# Patient Record
Sex: Female | Born: 2000 | Race: Black or African American | Hispanic: No | Marital: Single | Smoking: Never smoker
Health system: Southern US, Community
[De-identification: ages and names within clinical notes are randomized; demographics above are authoritative.]

---
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
## 2021-02-24 ENCOUNTER — Other Ambulatory Visit: Payer: Self-pay

## 2021-02-24 ENCOUNTER — Ambulatory Visit (HOSPITAL_COMMUNITY)
Admission: RE | Admit: 2021-02-24 | Discharge: 2021-02-24 | Disposition: A | Discharge status: Home / Self Care | Payer: Medicaid Other | Source: Ambulatory Visit | Attending: Internal Medicine | Admitting: Internal Medicine

## 2021-02-24 ENCOUNTER — Encounter (HOSPITAL_COMMUNITY): Payer: Self-pay

## 2021-02-24 VITALS — BP 120/78 | HR 80 | Temp 98.7°F | Resp 17 | Wt 255.0 lb

## 2021-02-24 DIAGNOSIS — N898 Other specified noninflammatory disorders of vagina: Secondary | ICD-10-CM

## 2021-02-24 DIAGNOSIS — Z113 Encounter for screening for infections with a predominantly sexual mode of transmission: Secondary | ICD-10-CM | POA: Diagnosis present

## 2021-02-24 MED ORDER — LIDOCAINE HCL (PF) 1 % IJ SOLN
INTRAMUSCULAR | Status: AC
  Filled 2021-02-24: qty 2

## 2021-02-24 MED ORDER — CEFTRIAXONE SODIUM 500 MG IJ SOLR
500.0000 mg | Freq: Once | INTRAMUSCULAR | Status: AC
  Administered 2021-02-24: 500 mg via INTRAMUSCULAR

## 2021-02-24 MED ORDER — CEFTRIAXONE SODIUM 500 MG IJ SOLR
INTRAMUSCULAR | Status: AC
  Filled 2021-02-24: qty 500

## 2021-02-24 NOTE — Discharge Instructions (Addendum)
We went ahead and treated you today for gonorrhea.  Further testing has been sent off.  Our nurse will contact you should you require any further treatment.  Please return for any worsening or persistent symptoms.

## 2021-02-24 NOTE — ED Provider Notes (Signed)
[facility]    CSN: [id number] Arrival date & time: 02/24/21  0950      History   Chief Complaint Chief Complaint  Patient presents with  . Appointment  . Vaginal Discharge  . SEXUALLY TRANSMITTED DISEASE  . Dysuria    HPI Leslie Perkins is a 20 y.o. female   Reports burning c urination and pain c intercourse in February and March that has subsided.  States she had significant amount of greenish vaginal discharge at that time that has since improved but still with some discharge.  She denies any recent fever or chills, headache, eye pain, sore throat, dysuria.  States ex-boyfriend recently told her he was treated for gonorrhea in April and wanted to be tested.   History reviewed. No pertinent past medical history.  There are no problems to display for this patient.   History reviewed. No pertinent surgical history.  OB History   No obstetric history on file.      Home Medications    Prior to Admission medications   Not on File    Family History History reviewed. No pertinent family history.  Social History Social History   Tobacco Use  . Smoking status: Light Tobacco Smoker  . Smokeless tobacco: Never Used     Allergies   Patient has no known allergies.   Review of Systems As stated in HPI otherwise negative   Physical Exam Triage Vital Signs ED Triage Vitals  Enc Vitals Group     BP 02/24/21 1012 120/78     Pulse Rate 02/24/21 1012 80     Resp 02/24/21 1012 17     Temp 02/24/21 1012 98.7 F (37.1 C)     Temp Source 02/24/21 1012 Oral     SpO2 02/24/21 1012 97 %     Weight --      Height --      Head Circumference --      Peak Flow --      Pain Score 02/24/21 1009 0     Pain Loc --      Pain Edu? --      Excl. in GC? --    No data found.  Updated Vital Signs BP 120/78 (BP Location: Right Arm)   Pulse 80   Temp 98.7 F (37.1 C) (Oral)   Resp 17   Wt 115.7 kg   LMP 02/17/2021 (Exact Date)   SpO2 97%   Visual  Acuity Right Eye Distance:   Left Eye Distance:   Bilateral Distance:    Right Eye Near:   Left Eye Near:    Bilateral Near:     Physical Exam Constitutional:      General: She is not in acute distress.    Appearance: Normal appearance. She is not ill-appearing or toxic-appearing.  Abdominal:     General: Bowel sounds are normal.     Palpations: Abdomen is soft.  Skin:    General: Skin is warm and dry.  Neurological:     General: No focal deficit present.     Mental Status: She is alert.  Psychiatric:        Mood and Affect: Mood normal.        Behavior: Behavior normal.     UC Treatments / Results  Labs (all labs ordered are listed, but only abnormal results are displayed) Labs Reviewed  CERVICOVAGINAL ANCILLARY ONLY    EKG   Radiology No results found.  Procedures Procedures (including critical care time)  Medications Ordered in UC Medications  cefTRIAXone (ROCEPHIN) injection 500 mg (500 mg Intramuscular Given 02/24/21 1105)    Initial Impression / Assessment and Plan / UC Course  I have reviewed the triage vital signs and the nursing notes.  Pertinent labs & imaging results that were available during my care of the patient were reviewed by me and considered in my medical decision making (see chart for details).  Vaginal discharge  -Known exposure to gonorrhea therefore we will go ahead and treat with IM Rocephin -We will send STI screening to determine need for any further treatment -Follow-up for upper persistent or worsening symptoms  Reviewed expections re: course of current medical issues. Questions answered. Outlined signs and symptoms indicating need for more acute intervention. Pt verbalized understanding. AVS given  Final Clinical Impressions(s) / UC Diagnoses   Final diagnoses:  Vaginal discharge  Routine screening for STI (sexually transmitted infection)     Discharge Instructions     We went ahead and treated you today for  gonorrhea.  Further testing has been sent off.  Our nurse will contact you should you require any further treatment.  Please return for any worsening or persistent symptoms.    ED Prescriptions    None     PDMP not reviewed this encounter.   Rolla Etienne, NP 02/24/21 [phone]

## 2021-02-24 NOTE — ED Triage Notes (Signed)
Pt c/o vaginal discharge and dysuria X 4 months. Pt states her ex partner recently told her he had Gonorrhea in April.

## 2021-02-25 LAB — CERVICOVAGINAL ANCILLARY ONLY
Bacterial Vaginitis (gardnerella): POSITIVE — AB
Chlamydia: NEGATIVE
Comment: NEGATIVE
Comment: NEGATIVE
Comment: NEGATIVE
Comment: NORMAL
Neisseria Gonorrhea: NEGATIVE
Trichomonas: POSITIVE — AB

## 2021-02-28 ENCOUNTER — Telehealth (HOSPITAL_COMMUNITY): Payer: Self-pay | Admitting: Emergency Medicine

## 2021-02-28 MED ORDER — METRONIDAZOLE 500 MG PO TABS
500.0000 mg | ORAL_TABLET | Freq: Two times a day (BID) | ORAL | 0 refills | Status: DC
Start: 2021-02-28 — End: 2021-08-30

## 2021-07-25 ENCOUNTER — Encounter (HOSPITAL_COMMUNITY): Payer: Self-pay | Admitting: Emergency Medicine

## 2021-07-25 ENCOUNTER — Ambulatory Visit (HOSPITAL_COMMUNITY)
Admission: EM | Admit: 2021-07-25 | Discharge: 2021-07-25 | Disposition: A | Discharge status: Home / Self Care | Payer: Medicaid Other | Attending: Student | Admitting: Student

## 2021-07-25 ENCOUNTER — Other Ambulatory Visit: Payer: Self-pay

## 2021-07-25 ENCOUNTER — Ambulatory Visit (HOSPITAL_COMMUNITY): Payer: Self-pay

## 2021-07-25 DIAGNOSIS — Z3A01 Less than 8 weeks gestation of pregnancy: Secondary | ICD-10-CM

## 2021-07-25 DIAGNOSIS — Z3201 Encounter for pregnancy test, result positive: Secondary | ICD-10-CM | POA: Diagnosis not present

## 2021-07-25 LAB — POC URINE PREG, ED: Preg Test, Ur: POSITIVE — AB

## 2021-07-25 NOTE — ED Triage Notes (Signed)
Pt LMP 8/20. Reports missed period in September and took home test that was positive. Pt needing confirmation that she is pregnant.

## 2021-07-25 NOTE — Discharge Instructions (Signed)
-  You're pregnant -You can establish care with an OB- information below

## 2021-07-25 NOTE — ED Provider Notes (Signed)
[facility]    CSN: [id number] Arrival date & time: 07/25/21  1515      History   Chief Complaint Chief Complaint  Patient presents with   Possible Pregnancy    HPI Damyiah Wagoner is a 20 y.o. female presenting for confirmation of pregnancy test. Positive home test. LMP 8/20. Feeling well. Denies hematuria, dysuria, frequency, urgency, back pain, n/v/d/abd pain, fevers/chills, abdnormal vaginal discharge. Denies STI risk.   HPI  History reviewed. No pertinent past medical history.  There are no problems to display for this patient.   History reviewed. No pertinent surgical history.  OB History   No obstetric history on file.      Home Medications    Prior to Admission medications   Medication Sig Start Date End Date Taking? Authorizing Provider  metroNIDAZOLE (FLAGYL) 500 MG tablet Take 1 tablet (500 mg total) by mouth 2 (two) times daily. 02/28/21   Lamptey, Britta Mccreedy, MD    Family History No family history on file.  Social History Social History   Tobacco Use   Smoking status: Light Smoker   Smokeless tobacco: Never     Allergies   Patient has no known allergies.   Review of Systems Review of Systems  Constitutional:  Negative for appetite change, chills, diaphoresis and fever.  Respiratory:  Negative for shortness of breath.   Cardiovascular:  Negative for chest pain.  Gastrointestinal:  Negative for abdominal pain, blood in stool, constipation, diarrhea, nausea and vomiting.  Genitourinary:  Negative for decreased urine volume, difficulty urinating, dysuria, flank pain, frequency, genital sores, hematuria, urgency, vaginal bleeding, vaginal discharge and vaginal pain.  Musculoskeletal:  Negative for back pain.  Neurological:  Negative for dizziness, weakness and light-headedness.  All other systems reviewed and are negative.   Physical Exam Triage Vital Signs ED Triage Vitals [07/25/21 1651]  Enc Vitals Group     BP 125/85     Pulse  Rate 78     Resp 18     Temp 98.3 F (36.8 C)     Temp Source Oral     SpO2 98 %     Weight      Height      Head Circumference      Peak Flow      Pain Score 0     Pain Loc      Pain Edu?      Excl. in GC?    No data found.  Updated Vital Signs BP 125/85 (BP Location: Left Arm)   Pulse 78   Temp 98.3 F (36.8 C) (Oral)   Resp 18   LMP 06/11/2021   SpO2 98%   Visual Acuity Right Eye Distance:   Left Eye Distance:   Bilateral Distance:    Right Eye Near:   Left Eye Near:    Bilateral Near:     Physical Exam Vitals reviewed.  Constitutional:      General: She is not in acute distress.    Appearance: Normal appearance. She is not ill-appearing.  HENT:     Head: Normocephalic and atraumatic.     Mouth/Throat:     Mouth: Mucous membranes are moist.     Comments: Moist mucous membranes Eyes:     Extraocular Movements: Extraocular movements intact.     Pupils: Pupils are equal, round, and reactive to light.  Cardiovascular:     Rate and Rhythm: Normal rate and regular rhythm.     Heart sounds: Normal heart sounds.  Pulmonary:     Effort: Pulmonary effort is normal.     Breath sounds: Normal breath sounds. No wheezing, rhonchi or rales.  Abdominal:     General: Bowel sounds are normal. There is no distension.     Palpations: Abdomen is soft. There is no mass.     Tenderness: There is no abdominal tenderness. There is no right CVA tenderness, left CVA tenderness, guarding or rebound.  Skin:    General: Skin is warm.     Capillary Refill: Capillary refill takes less than 2 seconds.     Comments: Good skin turgor  Neurological:     General: No focal deficit present.     Mental Status: She is alert and oriented to person, place, and time.  Psychiatric:        Mood and Affect: Mood normal.        Behavior: Behavior normal.     UC Treatments / Results  Labs (all labs ordered are listed, but only abnormal results are displayed) Labs Reviewed  POC URINE  PREG, ED - Abnormal; Notable for the following components:      Result Value   Preg Test, Ur POSITIVE (*)    All other components within normal limits    EKG   Radiology No results found.  Procedures Procedures (including critical care time)  Medications Ordered in UC Medications - No data to display  Initial Impression / Assessment and Plan / UC Course  I have reviewed the triage vital signs and the nursing notes.  Pertinent labs & imaging results that were available during my care of the patient were reviewed by me and considered in my medical decision making (see chart for details).     This patient is a very pleasant 20 y.o. year old female presenting for confirmation of positive pregnancy test. Afebrile, nontachycardic, no reproducible abd pain or CVAT. LMP 8/20. U-preg: positive. OB resources provided., ED return precautions discussed. Patient verbalizes understanding and agreement.    Final Clinical Impressions(s) / UC Diagnoses   Final diagnoses:  Positive pregnancy test  Pregnancy with fewer than 8 completed weeks gestation     Discharge Instructions      -You're pregnant -You can establish care with an OB- information below     ED Prescriptions   None    PDMP not reviewed this encounter.   Rhys Martini, PA-C 07/25/21 1709

## 2021-08-24 ENCOUNTER — Telehealth (INDEPENDENT_AMBULATORY_CARE_PROVIDER_SITE_OTHER): Payer: Medicaid Other

## 2021-08-24 ENCOUNTER — Other Ambulatory Visit: Payer: Self-pay

## 2021-08-24 DIAGNOSIS — Z136 Encounter for screening for cardiovascular disorders: Secondary | ICD-10-CM

## 2021-08-24 DIAGNOSIS — Z3480 Encounter for supervision of other normal pregnancy, unspecified trimester: Secondary | ICD-10-CM | POA: Insufficient documentation

## 2021-08-24 DIAGNOSIS — Z3A00 Weeks of gestation of pregnancy not specified: Secondary | ICD-10-CM

## 2021-08-24 MED ORDER — BLOOD PRESSURE MONITORING DEVI: 1.0000 | 0 refills | Status: AC

## 2021-08-24 NOTE — Progress Notes (Signed)
New OB Intake  I connected with  Leslie Perkins on 08/24/21 at  1:15 PM EDT by MyChart Video Visit and verified that I am speaking with the correct person using two identifiers. Nurse is located at [facility] and pt is located at home .  I discussed the limitations, risks, security and privacy concerns of performing an evaluation and management service by telephone and the availability of in person appointments. I also discussed with the patient that there may be a patient responsible charge related to this service. The patient expressed understanding and agreed to proceed.  I explained I am completing New OB Intake today. We discussed her EDD of 03/18/22 that is based on LMP of 06/11/21. Pt is G1/P0. I reviewed her allergies, medications, Medical/Surgical/OB history, and appropriate screenings. I informed her of [facility] services. Based on history, this is a/an  pregnancy uncomplicated .   There are no problems to display for this patient.   Concerns addressed today  Delivery Plans:  Plans to deliver at [facility].   MyChart/Babyscripts MyChart access verified. I explained pt will have some visits in office and some virtually. Babyscripts instructions given and order placed. Patient verifies receipt of registration text/e-mail. Account successfully created and app downloaded.  Blood Pressure Cuff  Blood pressure cuff ordered for patient to pick-up from [employer]. Explained after first prenatal appt pt will check weekly and document in Babyscripts.  Weight scale: Patient    have weight scale. Weight scale ordered for patient to pick up form [facility].   Anatomy US Explained first scheduled [place] will be around 19 weeks. Anatomy US scheduled for 10/21/21 at 10:30a. Pt notified to arrive at 10:15a.  Labs Discussed Avelina Laine genetic screening with patient. Would like both Panorama and Horizon drawn at new OB visit. Routine prenatal labs needed.  Covid Vaccine Patient has not covid vaccine.    Mother/ Baby Dyad Candidate?    If yes, offer as possibility  Informed patient of [facility] website  and placed link in her AVS.   Social Determinants of Health Food Insecurity: Patient expresses food insecurity. Food Market information given to patient; explained patient may visit at the end of first OB appointment. WIC Referral: Patient is interested in referral to [facility].  Transportation: Patient denies transportation needs. Childcare: Discussed no children allowed at ultrasound appointments. Offered childcare services; patient declines childcare services at this time.  Send link to Pregnancy Navigators   Placed OB Box on problem list and updated  First visit review I reviewed new OB appt with pt. I explained she will have a pelvic exam, ob bloodwork with genetic screening, and PAP smear. Explained pt will be seen by Dr. Merian Capron at first visit; encounter routed to appropriate provider. Explained that patient will be seen by pregnancy navigator following visit with provider. [facility] information placed in AVS.   Leslie Perkins, CMA 08/24/2021  1:31 PM

## 2021-08-24 NOTE — Patient Instructions (Signed)
AREA PEDIATRIC/FAMILY PRACTICE PHYSICIANS  [place] ([address]) [facility] Chambliss, MD; Eniola, MD; Hale, MD; Hensel, MD; McDiarmid, MD; McIntyer, MD; Bette Brienza, MD; Walden, MD [address] [place], [place] [address] [phone] Mon-Fri 8:30-12:30, 1:30-5:00 Providers come to see babies at [facility] Accepting Medicaid [facility] providers who accept newborns: Koirala, MD; Morrow, MD; Wolters, MD [address], [place], [place] [address] [phone] Mon-Fri 8:00-5:30 Babies seen by providers at [facility] Does NOT accept Medicaid Please call early in hospitalization for appointment (limited availability)  [facility] Mulberry, MD [address] [place], [place] [address] [phone] Mon, Tue, Thur, Fri 8:30-5:00, Wed 10:00-7:00 (closed 1-2pm) Babies seen by [facility] providers Accepting Medicaid Rubin - Pediatrician Rubin, MD [address], [place], [place] [address] [phone] Mon-Fri 8:30-5:00, Sat 8:30-12:00 Provider comes to see babies at [facility] Accepting Medicaid Must have been referred from current patients or contacted office prior to delivery [facility] ([facility]) Brown, MD; Chandler, MD; Ettefagh, MD; Grant, MD; [place], MD; McCormick, MD; McQueen, MD; Prose, MD; Simha, MD; Stanley, MD; Stryffeler, NP; Tebben, NP [address], [place], [place] [address] [phone] Mon, Tue, Thur, Fri 8:30-5:30, Wed 9:30-5:30, Sat 8:30-12:30 Babies seen by [facility] providers Accepting Medicaid Only accepting infants of first-time parents or siblings of current patients Hospital discharge coordinator will make follow-up appointment Jack Amos [address], [place], [place]  [address] [phone]   Fax - [phone] [facility] [address], [place], [place]  [address] Phone - [phone]   Fax - [phone] Shilpa Gosrani [address], [place], [place]  [address] [phone]  [place] ([address]) [facility] Bates, MD; Brassfield, MD; Cooper, Cox, MD; MD; Davis, MD; Dovico, MD; Ettefaugh, MD; Little, MD; Lowe, MD; Keiffer, MD; Melvin, MD; Sumner, MD; Williams, MD [address], [place], [place] [address] [phone] Mon-Fri 8:30-5:00 (extended evenings Mon-Thur as needed), Sat-Sun 10:00-1:00 Providers come to see babies at [facility] Accepting Medicaid for families of first-time babies and families with all children in the household age 3 and under. Must register with office prior to making appointment (M-F only). [facility] Henson, NP; Knapp, MD; Lalonde, MD; Tysinger, PA [address] [place], [place] [address] [phone] Mon-Fri 8:00-5:00 Babies seen by providers at [facility] Does NOT accept Medicaid/Commercial Insurance Only [facility] ([facility])  Artis, MD; Barnes, MD; Bratton, MD; Coccaro, MD; Lockett Gardner, MD; Kramer, MD; Marshall, MD; Netherton, MD; Poleto, MD; Skinner, MD [address] [place], [place] [address] [phone] Mon-Fri 8:30-5:30, Sat (Oct.-Mar.) 9:00-1:00 Babies seen by providers at [facility] Accepting Medicaid  [place] ([address]) [facility] Reid, MD; Warner, MD [address], [place], [place] [address] [phone] Mon-Fri 8:30-5:00, Sat 8:30-12:00 Providers come to see babies at [facility] Does NOT accept Medicaid [facility] Becker, PA; Hagler, MD; Scifres, PA; Sun, MD; Swayne, MD [address], , [place] [address] [phone] Mon-Fri 8:00-5:00 Babies seen by providers at [facility] Does NOT accept Medicaid Only accepting babies of parents who  are patients Please call early in hospitalization for appointment (limited availability)  Pediatricians Clark, MD; Frye, MD; Kelleher, MD; Mack, NP; Miller, MD; O'Keller, MD; Patterson, NP; Pudlo, MD; Puzio, MD; Thomas, MD; Tucker, MD; Twiselton, MD [address], [place], [place] [address] [phone] Mon-Fri 8:00-5:00, Sat 9:00-12:00 Providers come to see babies at [facility] Does NOT accept Medicaid  [place] ([address]) [facility] providers accepting new patients: Brake, NP; Wharton, PA [address], [place], [place] [address] [phone] Mon-Fri 8:00-5:00 Babies seen by providers at [facility] Does NOT accept Medicaid Only accepting babies of parents who are patients Please call early in hospitalization for appointment (limited availability) [facility] Gay, MD; Quinlan, MD [address] [place], [place] [address] [phone] (press 1 to schedule appointment) Mon-Fri 8:00-5:00 Providers come to see babies at [facility] Does NOT accept Medicaid [facility] Mazer, MD [address] [place], [place] [address] [phone] Mon-Fri 8:30-5:00 (lunch 12:30-1:00), extended hours by appointment only Wed 5:00-6:30 Babies seen by [facility] providers Accepting Medicaid [facility] Banks, MD; Jordan, MD; Koberlein, MD [address], [place], [place] [address] [phone] Mon-Fri 8:00-5:00 Babies seen by [facility] providers Does NOT accept Medicaid [facility] Parker, MD; Hunter, MD; Wallace, DO [address] [place], [place] [address] [phone] Mon-Fri 8:00-5:00 Babies seen by [facility] providers Does NOT accept Medicaid [facility] Brandon, PA; Brecken, PA; Christy, NP; Dees, MD; DeClaire, MD; DeWeese, MD; Hansen, NP; Mills, NP; Parrish, NP; Smoot, NP; Summer, MD; Vapne,  MD [address] [place], [place] [address] [phone] Mon-Fri 8:30-5:00, Sat 10:00-1:00 Providers come to see babies at [facility] Does NOT accept Medicaid Free prenatal information session Tuesdays at 4:45pm [facility] Bouska, MD; Gordon, PA; Jeffery, PA; Weber, PA [address] [place] [address] [phone] Mon-Fri 7:30-5:30 Babies seen by [facility] providers [facility] [address], [place], [place]  [address] [phone]   Fax - [phone]  [place] ([address] & [address]) [facility] [place] [address] [place], [place] [address] [phone] Mon-Thur 8:00-6:00 Providers come to see babies at [facility] Accepting Medicaid [facility] Anderson, NP; Badger, MD; Beal, PA; Spencer, PA [address] [place], [place] [address] [phone] Mon-Thur 7:30-7:30, Fri 7:30-4:30 Babies seen by [facility] providers Accepting Medicaid [facility] Agbuya, MD; Klett, NP; Romgoolam, MD [address], [place], [place] [address] [phone] Mon-Fri 8:30-5:00, Sat 8:30-12:00 Providers come to see babies at [facility] Accepting Medicaid Must have "Meet & Greet" appointment at office prior to delivery [facility] ([facility]) McCord, MD; Wallace, MD; Wood, MD [address], [place], [place] [address] [phone] Mon-Wed 8:00-6:00, Thur-Fri 8:00-5:00, Sat 9:00-12:00 Providers come to see babies at [facility] Does NOT accept Medicaid Only accepting siblings of current patients [facility]  [address], [place], [place]  [address] [phone]   Fax - [phone] [facility] [address], [place], [place]  [address] [phone]   Fax -  [phone]  [place] ([address] & [address]) [facility] Cirigliano, DO; Matthews, DO [address] [place], [place] [address] [phone] Mon-Fri 7:00-5:00 Babies seen by [facility] providers Does NOT accept Medicaid [facility] Briscoe, MD; Howley, PA; Moreira, PA [address], [place], [place] [address] [phone] Mon-Fri 8:00-5:00 Babies seen by [facility] providers Accepting Medicaid [facility] Boyd, MD; Church, PA; Jones, NP; Osborn, PA [address], , [place] [address] [phone] Mon-Fri 8:00-5:00 Babies seen by providers at [facility] Accepting Medicaid  [place] ([address]) [facility] [place] Wendling, DO [address] [place], [place] [address] [phone] Mon-Fri 8:00-5:00 Babies seen by [facility] providers Does NOT accept Medicaid Limited availability, please call early in hospitalization to schedule follow-up [facility] Calderon, PA; Cummings, MD; Dillard, MD; Martin, PA; Olson, MD; VanDeven, PA [address], [place], [place] [address] [phone] Mon-Fri 8:30-5:00, Sat 9:00-12:00 Babies seen by providers at [facility] Accepting Medicaid Please register online then schedule online or call office [email or web address] Hunter, NP; Kumar, MD; Martin Rogers, PA [address], [place], [place] [address] [phone] Mon-Fri 8:00-5:00 Babies seen by providers at [facility] Accepting Medicaid [facility] ([facility]) McFarland, MD; Kristi Fleenor, NP; West, MD [address], [place], [place] [address] [phone] Mon-Fri 8:00-5:30, Sat&Sun by appointment (phones open at  8:30) Babies seen by [facility] providers Accepting Medicaid Must be a first-time baby or sibling of current patient [facility] [place]  [address], [place], [place]  [address] [phone]   Fax - [phone]  [place] ([address]) [facility] Brown, PA; Cowen, PA; Rice, MD; Helton, PA; Spry, MD [address] [place], [place] [address] [phone] Mon-Thur 8:00-7:00, Fri 8:00-5:00, Sat 8:00-12:00, Sun 9:00-12:00 Babies seen by [facility] providers Accepting Medicaid [facility] Coe-Goins, MD; Marshall, MD; Pierre-Louis, MD [address], [place], [place] [address] [phone] Mon-Thur 8:00-5:00 Babies seen by providers at [facility] Accepting Medicaid [facility] Bratton, MD; Coe-Goins, MD; Hayes, MD; Lewis, MD; List, MD; Lott, MD; Marshall, MD; Moran, MD; O'Ernan Runkles, MD; Pierre-Louis, MD; Pitonzo, MD; Scholer, MD; Spangle, MD [address] [place], [place] [address] [phone] Mon-Fri 8:00-5:30, Sat (Oct.-Mar.) 9:00-1:00 Babies seen by providers at [facility] Accepting Medicaid Must fill out new patient packet, available online at [email or web address] [facility] ([facility]) Friddle, NP; Harris, NP; Kelly, NP; Logan, MD; Melvin, PA; Poth, MD; Ramadoss, MD; Stanton, NP [address], [place], [place] [address] [phone] Mon-Thur 8:00-5:30, Fri 8:00-5:00 Babies seen by providers at [facility] Accepting Medicaid  [place] ([address]) [facility] Dixon, PA; Wilkes-Barre, MD; Pickard, MD; Tapia, PA [address], [place], [place] [address] [phone] Mon-Fri 8:00-5:00 Babies seen by providers at [facility] Accepting Medicaid   [place] ([address]) [facility] [place] Masneri, DO; Meyers, MD; Nelson, PA [address], [place], [place] [address] [phone] Mon-Fri 8:00-5:00 Babies seen by providers at [facility] Does NOT accept Medicaid Limited appointment availability, please call early in hospitalization  [facility] [place] Kunedd, DO; McGowen, MD [address], [place], [place] [address] [phone] Mon-Fri 8:00-5:00 Babies seen by [facility] providers Does NOT accept Medicaid [facility] [place] Cameron, MD; MacDonald, MD; Michaels, PA; Nayak, MD [address] [place] [address], [place], [place] [address] [phone] Mon-Fri 8:00-5:00 After hours clinic ([address] [place], [place] [address]) [phone] Mon-Fri 5:00-8:00, Sat 12:00-6:00, Sun 10:00-4:00 Babies seen by [facility] providers Accepting Medicaid [facility] [place] [address], [place], [place]  [address] [phone]   Fax - [phone]  [place] ([address]) [facility] [place] [facility] Andy, MD [address], [place], [place] [address] [phone] Mon-Fri 8:00-5:00 Babies seen by [facility] providers Does NOT accept Medicaid [facility] [place] ([facility] [place]) Eksir, MD [address], [place], [place] [address] [phone] Mon-Thur 8:00-7:00, Fri 8:00-5:00, Sat 8:00-12:00 Babies seen by providers at [facility] Accepting Medicaid - but does not have vaccinations in office (must be received elsewhere) Limited availability, please call early in hospitalization  [place] ([address]) [facility]  Charlene Flemming, MD [address], [place] [address] [phone]  Fax [phone]  [facility]  Kimberly Newton, MD, Annamarie Streilein, PA, Carla Hampton, PA [address] [place], [place]  [address] [phone] [facility]  [address], [place], [place] [address] [phone] [address], [place], [place] [address] [phone] (West Office)  [facility] [address], [place], [place] [address] [phone] [facility] [address] [place], [place], [place] [address] [phone] [facility] [address], [place], [place] [address] [phone] [facility] [address], [place], [place] [address] [phone] [facility] [address], [place], [place] [address] [phone] [facility] [address], [place], [place] [address] [phone] [facility]  [address], [place], [place] [address] [phone] Dr[facility] [address] [place], [place], [place] [address] [phone] [facility] [address], [place], [place] [address] [phone] [facility] [address], ,  [address] [phone]   At our [facility], we work as an integrated team, providing care to address both physical and emotional health. Your medical provider may refer you to see our Behavioral Health Clinician (BHC) on the same day you see your medical provider, as availability permits; often scheduled virtually at your convenience.  Our BHC is available to all patients, visits generally last between 20-30 minutes, but can be longer or shorter, depending on patient need. The BHC offers help with stress management, coping with symptoms of depression and anxiety, major life changes , sleep issues, changing risky behavior, grief and loss, life stress, working on personal life goals, and  behavioral health issues, as these all affect your overall health and wellness.  The BHC is NOT available for the following: FMLA paperwork, court-ordered evaluations, specialty assessments (custody or disability), letters to employers, or  obtaining certification for an emotional support animal. The BHC does not provide long-term therapy. You have the right to refuse integrated behavioral health services, or to reschedule to see the BHC at a later date.  Confidentiality exception: If it is suspected that a child or disabled adult is being abused or neglected, we are required by law to report that to either Child Protective Services or Adult Protective Services.  If you have a diagnosis of Bipolar affective disorder, Schizophrenia, or recurrent Major depressive disorder, we will recommend that you establish care with a psychiatrist, as these are lifelong, chronic conditions, and we want your overall emotional health and medications to be more closely monitored. If you anticipate needing extended maternity leave due to mental health issues postpartum, it it recommended you inform your medical provider, so we can put in a referral to a psychiatrist as soon as possible. The BHC is unable to recommend an extended maternity leave for mental health issues. Your medical provider   or BHC may refer you to a therapist for ongoing, traditional therapy, or to a psychiatrist, for medication management, if it would benefit your overall health. Depending on your insurance, you may have a copay or be charged a deductible, depending on your insurance, to see the BHC. If you are uninsured, it is recommended that you apply for financial assistance. (Forms may be requested at the front desk for in-person visits, via MyChart, or request a form during a virtual visit).  If you see the BHC more than 6 times, you will have to complete a comprehensive clinical assessment interview with the BHC to resume integrated services.  For virtual visits with the BHC, you must be physically in the state of [place] at the time of the visit. For example, if you live in [place], you will have to do an in-person visit with the BHC, and your out-of-state insurance may not cover  behavioral health services in [place]. If you are going out of the state or country for any reason, the BHC may see you virtually when you return to [place], but not while you are physically outside of [place].   

## 2021-08-30 ENCOUNTER — Encounter: Payer: Medicaid Other | Admitting: Nurse Practitioner

## 2021-08-30 ENCOUNTER — Other Ambulatory Visit: Payer: Self-pay

## 2021-08-30 ENCOUNTER — Ambulatory Visit (INDEPENDENT_AMBULATORY_CARE_PROVIDER_SITE_OTHER): Payer: Medicaid Other | Admitting: Family Medicine

## 2021-08-30 VITALS — BP 136/80 | HR 76 | Ht 68.0 in | Wt 269.8 lb

## 2021-08-30 DIAGNOSIS — Z3480 Encounter for supervision of other normal pregnancy, unspecified trimester: Secondary | ICD-10-CM | POA: Diagnosis not present

## 2021-08-30 DIAGNOSIS — Z23 Encounter for immunization: Secondary | ICD-10-CM

## 2021-08-30 MED ORDER — PROMETHAZINE HCL 12.5 MG PO TABS: 12.5000 mg | ORAL_TABLET | Freq: Four times a day (QID) | ORAL | 2 refills | Status: DC | PRN

## 2021-08-30 NOTE — Patient Instructions (Signed)
Second Trimester of Pregnancy °The second trimester of pregnancy is from week 13 through week 27. This is months 4 through 6 of pregnancy. The second trimester is often a time when you feel your best. Your body has adjusted to being pregnant, and you begin to feel better physically. °During the second trimester: °Morning sickness has lessened or stopped completely. °You may have more energy. °You may have an increase in appetite. °The second trimester is also a time when the unborn baby (fetus) is growing rapidly. At the end of the sixth month, the fetus may be up to 12 inches long and weigh about 1½ pounds. You will likely begin to feel the baby move (quickening) between 16 and 20 weeks of pregnancy. °Body changes during your second trimester °Your body continues to go through many changes during your second trimester. The changes vary and generally return to normal after the baby is born. °Physical changes °Your weight will continue to increase. You will notice your lower abdomen bulging out. °You may begin to get stretch marks on your hips, abdomen, and breasts. °Your breasts will continue to grow and to become tender. °Dark spots or blotches (chloasma or mask of pregnancy) may develop on your face. °A dark line from your belly button to the pubic area (linea nigra) may appear. °You may have changes in your hair. These can include thickening of your hair, rapid growth, and changes in texture. Some people also have hair loss during or after pregnancy, or hair that feels dry or thin. °Health changes °You may develop headaches. °You may have heartburn. °You may develop constipation. °You may develop hemorrhoids or swollen, bulging veins (varicose veins). °Your gums may bleed and may be sensitive to brushing and flossing. °You may urinate more often because the fetus is pressing on your bladder. °You may have back pain. This is caused by: °Weight gain. °Pregnancy hormones that are relaxing the joints in your  pelvis. °A shift in weight and the muscles that support your balance. °Follow these instructions at home: °Medicines °Follow your health care provider's instructions regarding medicine use. Specific medicines may be either safe or unsafe to take during pregnancy. Do not take any medicines unless approved by your health care provider. °Take a prenatal vitamin that contains at least 600 micrograms (mcg) of folic acid. °Eating and drinking °Eat a healthy diet that includes fresh fruits and vegetables, whole grains, good sources of protein such as meat, eggs, or tofu, and low-fat dairy products. °Avoid raw meat and unpasteurized juice, milk, and cheese. These carry germs that can harm you and your baby. °You may need to take these actions to prevent or treat constipation: °Drink enough fluid to keep your urine pale yellow. °Eat foods that are high in fiber, such as beans, whole grains, and fresh fruits and vegetables. °Limit foods that are high in fat and processed sugars, such as fried or sweet foods. °Activity °Exercise only as directed by your health care provider. Most people can continue their usual exercise routine during pregnancy. Try to exercise for 30 minutes at least 5 days a week. Stop exercising if you develop contractions in your uterus. °Stop exercising if you develop pain or cramping in the lower abdomen or lower back. °Avoid exercising if it is very hot or humid or if you are at a high altitude. °Avoid heavy lifting. °If you choose to, you may have sex unless your health care provider tells you not to. °Relieving pain and discomfort °Wear a supportive bra   to prevent discomfort from breast tenderness. °Take warm sitz baths to soothe any pain or discomfort caused by hemorrhoids. Use hemorrhoid cream if your health care provider approves. °Rest with your legs raised (elevated) if you have leg cramps or low back pain. °If you develop varicose veins: °Wear support hose as told by your health care  provider. °Elevate your feet for 15 minutes, 3-4 times a day. °Limit salt in your diet. °Safety °Wear your seat belt at all times when driving or riding in a car. °Talk with your health care provider if someone is verbally or physically abusive to you. °Lifestyle °Do not use hot tubs, steam rooms, or saunas. °Do not douche. Do not use tampons or scented sanitary pads. °Avoid cat litter boxes and soil used by cats. These carry germs that can cause birth defects in the baby and possibly loss of the fetus by miscarriage or stillbirth. °Do not use herbal remedies, alcohol, illegal drugs, or medicines that are not approved by your health care provider. Chemicals in these products can harm your baby. °Do not use any products that contain nicotine or tobacco, such as cigarettes, e-cigarettes, and chewing tobacco. If you need help quitting, ask your health care provider. °General instructions °During a routine prenatal visit, your health care provider will do a physical exam and other tests. He or she will also discuss your overall health. Keep all follow-up visits. This is important. °Ask your health care provider for a referral to a local prenatal education class. °Ask for help if you have counseling or nutritional needs during pregnancy. Your health care provider can offer advice or refer you to specialists for help with various needs. °Where to find more information °American Pregnancy Association: americanpregnancy.org °American College of Obstetricians and Gynecologists: acog.org/en/Womens%20Health/Pregnancy °Office on Women's Health: womenshealth.gov/pregnancy °Contact a health care provider if you have: °A headache that does not go away when you take medicine. °Vision changes or you see spots in front of your eyes. °Mild pelvic cramps, pelvic pressure, or nagging pain in the abdominal area. °Persistent nausea, vomiting, or diarrhea. °A bad-smelling vaginal discharge or foul-smelling urine. °Pain when you  urinate. °Sudden or extreme swelling of your face, hands, ankles, feet, or legs. °A fever. °Get help right away if you: °Have fluid leaking from your vagina. °Have spotting or bleeding from your vagina. °Have severe abdominal cramping or pain. °Have difficulty breathing. °Have chest pain. °Have fainting spells. °Have not felt your baby move for the time period told by your health care provider. °Have new or increased pain, swelling, or redness in an arm or leg. °Summary °The second trimester of pregnancy is from week 13 through week 27 (months 4 through 6). °Do not use herbal remedies, alcohol, illegal drugs, or medicines that are not approved by your health care provider. Chemicals in these products can harm your baby. °Exercise only as directed by your health care provider. Most people can continue their usual exercise routine during pregnancy. °Keep all follow-up visits. This is important. °This information is not intended to replace advice given to you by your health care provider. Make sure you discuss any questions you have with your health care provider. °Document Revised: 03/17/2020 Document Reviewed: 01/22/2020 °Elsevier Patient Education © 2022 Elsevier Inc. ° °Contraception Choices °Contraception, also called birth control, refers to methods or devices that prevent pregnancy. °Hormonal methods °Contraceptive implant °A contraceptive implant is a thin, plastic tube that contains a hormone that prevents pregnancy. It is different from an intrauterine device (IUD). It   is inserted into the upper part of the arm by a health care provider. Implants can be effective for up to 3 years. °Progestin-only injections °Progestin-only injections are injections of progestin, a synthetic form of the hormone progesterone. They are given every 3 months by a health care provider. °Birth control pills °Birth control pills are pills that contain hormones that prevent pregnancy. They must be taken once a day, preferably at the  same time each day. A prescription is needed to use this method of contraception. °Birth control patch °The birth control patch contains hormones that prevent pregnancy. It is placed on the skin and must be changed once a week for three weeks and removed on the fourth week. A prescription is needed to use this method of contraception. °Vaginal ring °A vaginal ring contains hormones that prevent pregnancy. It is placed in the vagina for three weeks and removed on the fourth week. After that, the process is repeated with a new ring. A prescription is needed to use this method of contraception. °Emergency contraceptive °Emergency contraceptives prevent pregnancy after unprotected sex. They come in pill form and can be taken up to 5 days after sex. They work best the sooner they are taken after having sex. Most emergency contraceptives are available without a prescription. This method should not be used as your only form of birth control. °Barrier methods °Female condom °A female condom is a thin sheath that is worn over the penis during sex. Condoms keep sperm from going inside a woman's body. They can be used with a sperm-killing substance (spermicide) to increase their effectiveness. They should be thrown away after one use. °Female condom °A female condom is a soft, loose-fitting sheath that is put into the vagina before sex. The condom keeps sperm from going inside a woman's body. They should be thrown away after one use. °Diaphragm °A diaphragm is a soft, dome-shaped barrier. It is inserted into the vagina before sex, along with a spermicide. The diaphragm blocks sperm from entering the uterus, and the spermicide kills sperm. A diaphragm should be left in the vagina for 6-8 hours after sex and removed within 24 hours. °A diaphragm is prescribed and fitted by a health care provider. A diaphragm should be replaced every 1-2 years, after giving birth, after gaining more than 15 lb (6.8 kg), and after pelvic  surgery. °Cervical cap °A cervical cap is a round, soft latex or plastic cup that fits over the cervix. It is inserted into the vagina before sex, along with spermicide. It blocks sperm from entering the uterus. The cap should be left in place for 6-8 hours after sex and removed within 48 hours. A cervical cap must be prescribed and fitted by a health care provider. It should be replaced every 2 years. °Sponge °A sponge is a soft, circular piece of polyurethane foam with spermicide in it. The sponge helps block sperm from entering the uterus, and the spermicide kills sperm. To use it, you make it wet and then insert it into the vagina. It should be inserted before sex, left in for at least 6 hours after sex, and removed and thrown away within 30 hours. °Spermicides °Spermicides are chemicals that kill or block sperm from entering the cervix and uterus. They can come as a cream, jelly, suppository, foam, or tablet. A spermicide should be inserted into the vagina with an applicator at least 10-15 minutes before sex to allow time for it to work. The process must be repeated every time   you have sex. Spermicides do not require a prescription. °Intrauterine contraception °Intrauterine device (IUD) °An IUD is a T-shaped device that is put in a woman's uterus. There are two types: °Hormone IUD.This type contains progestin, a synthetic form of the hormone progesterone. This type can stay in place for 3-5 years. °Copper IUD.This type is wrapped in copper wire. It can stay in place for 10 years. °Permanent methods of contraception °Female tubal ligation °In this method, a woman's fallopian tubes are sealed, tied, or blocked during surgery to prevent eggs from traveling to the uterus. °Hysteroscopic sterilization °In this method, a small, flexible insert is placed into each fallopian tube. The inserts cause scar tissue to form in the fallopian tubes and block them, so sperm cannot reach an egg. The procedure takes about 3  months to be effective. Another form of birth control must be used during those 3 months. °Female sterilization °This is a procedure to tie off the tubes that carry sperm (vasectomy). After the procedure, the man can still ejaculate fluid (semen). Another form of birth control must be used for 3 months after the procedure. °Natural planning methods °Natural family planning °In this method, a couple does not have sex on days when the woman could become pregnant. °Calendar method °In this method, the woman keeps track of the length of each menstrual cycle, identifies the days when pregnancy can happen, and does not have sex on those days. °Ovulation method °In this method, a couple avoids sex during ovulation. °Symptothermal method °This method involves not having sex during ovulation. The woman typically checks for ovulation by watching changes in her temperature and in the consistency of cervical mucus. °Post-ovulation method °In this method, a couple waits to have sex until after ovulation. °Where to find more information °Centers for Disease Control and Prevention: www.cdc.gov °Summary °Contraception, also called birth control, refers to methods or devices that prevent pregnancy. °Hormonal methods of contraception include implants, injections, pills, patches, vaginal rings, and emergency contraceptives. °Barrier methods of contraception can include female condoms, female condoms, diaphragms, cervical caps, sponges, and spermicides. °There are two types of IUDs (intrauterine devices). An IUD can be put in a woman's uterus to prevent pregnancy for 3-5 years. °Permanent sterilization can be done through a procedure for males and females. Natural family planning methods involve nothaving sex on days when the woman could become pregnant. °This information is not intended to replace advice given to you by your health care provider. Make sure you discuss any questions you have with your health care provider. °Document  Revised: 03/15/2020 Document Reviewed: 03/15/2020 °Elsevier Patient Education © 2022 Elsevier Inc. ° °

## 2021-08-30 NOTE — Progress Notes (Signed)
     Subjective:   Leslie Perkins is a 20 y.o. G1P0 at [redacted]w[redacted]d by LMP being seen today for her first obstetrical visit.  Her obstetrical history is significant for  n/a . Patient does intend to breast feed. Pregnancy history fully reviewed.  Patient reports nausea.  HISTORY: OB History  Gravida Para Term Preterm AB Living  1 0 0 0 0 0  SAB IAB Ectopic Multiple Live Births  0 0 0 0 0    # Outcome Date GA Lbr Len/2nd Weight Sex Delivery Anes PTL Lv  1 Current              Last pap smear: No results found for: DIAGPAP, HPV, HPVHIGH N/a  History reviewed. No pertinent past medical history. History reviewed. No pertinent surgical history. History reviewed. No pertinent family history. Social History   Tobacco Use   Smoking status: Light Smoker   Smokeless tobacco: Never   No Known Allergies Current Outpatient Medications on File Prior to Visit  Medication Sig Dispense Refill   Blood Pressure Monitoring DEVI 1 each by Does not apply route once a week. 1 each 0   Prenatal Vit-Fe Fumarate-FA (MULTIVITAMIN-PRENATAL) 27-0.8 MG TABS tablet Take 1 tablet by mouth daily at 12 noon.     No current facility-administered medications on file prior to visit.     Exam   Vitals:   08/30/21 1101 08/30/21 1110  BP: 136/80   Pulse: 76   Weight: 269 lb 12.8 oz (122.4 kg)   Height:  5\' 8"  (1.727 m)   Fetal Heart Rate (bpm): 158  System: General: well-developed, well-nourished female in no acute distress   Skin: normal coloration and turgor, no rashes   Neurologic: oriented, normal, negative, normal mood   Extremities: normal strength, tone, and muscle mass, ROM of all joints is normal   HEENT PERRLA, extraocular movement intact and sclera clear, anicteric   Neck supple and no masses   Respiratory:  no respiratory distress      Assessment:   Pregnancy: G1P0 Patient Active Problem List   Diagnosis Date Noted   Supervision of other normal pregnancy, antepartum 08/24/2021      Plan:  1. Supervision of other normal pregnancy, antepartum Initial labs drawn. Phenergan for morning sickness Continue prenatal vitamins. Genetic Screening discussed, NIPS: ordered. Ultrasound discussed; fetal anatomic survey: ordered. Problem list reviewed and updated. The nature of [facility] clinic was explained to patient; Voiced they may need to be seen by other [facility] providers which includes family medicine physicians, OB GYNs, and APPs. Delivery will hopefully be with one of the [facility] providers or another [facility] Medicine physician and we cannot promise this at this time.  Discussed there are [facility] staff in the hospital 24-7 and they understand and support this model and there is a likelihood one of these providers will catch their baby.  We also discussed that the service includes learners (residents, student) and they will be involved in the care team.  - Genetic Screening - CBC/D/Plt+RPR+Rh+ABO+RubIgG... - Hemoglobin A1c - Culture, OB Urine - [facility] AMB BABYSCRIPTS SCHEDULE OPTIMIZATION - AMBULATORY REFERRAL TO [facility]   Routine obstetric precautions reviewed. Return in 4 weeks (on 09/27/2021) for [facility] patient, ob visit.

## 2021-08-31 LAB — CBC/D/PLT+RPR+RH+ABO+RUBIGG...
Antibody Screen: NEGATIVE
Basophils Absolute: 0 10*3/uL (ref 0.0–0.2)
Basos: 0 %
EOS (ABSOLUTE): 0.1 10*3/uL (ref 0.0–0.4)
Eos: 1 %
HCV Ab: <0.1 s/co ratio (ref 0.0–0.9)
HIV Screen 4th Generation wRfx: NONREACTIVE
Hematocrit: 32.1 % — ABNORMAL LOW (ref 34.0–46.6)
Hemoglobin: 10.8 g/dL — ABNORMAL LOW (ref 11.1–15.9)
Hepatitis B Surface Ag: NEGATIVE
Immature Grans (Abs): 0 10*3/uL (ref 0.0–0.1)
Immature Granulocytes: 0 %
Lymphocytes Absolute: 2.3 10*3/uL (ref 0.7–3.1)
Lymphs: 25 %
MCH: 28.3 pg (ref 26.6–33.0)
MCHC: 33.6 g/dL (ref 31.5–35.7)
MCV: 84 fL (ref 79–97)
Monocytes Absolute: 0.8 10*3/uL (ref 0.1–0.9)
Monocytes: 9 %
Neutrophils Absolute: 6.1 10*3/uL (ref 1.4–7.0)
Neutrophils: 65 %
Platelets: 395 10*3/uL (ref 150–450)
RBC: 3.82 x10E6/uL (ref 3.77–5.28)
RDW: 13.5 % (ref 11.7–15.4)
RPR Ser Ql: NONREACTIVE
Rh Factor: POSITIVE
Rubella Antibodies, IGG: 15 index (ref >0.99)
WBC: 9.3 10*3/uL (ref 3.4–10.8)

## 2021-08-31 LAB — HCV INTERPRETATION

## 2021-08-31 LAB — HEMOGLOBIN A1C
Est. average glucose Bld gHb Est-mCnc: 114 mg/dL
Hgb A1c MFr Bld: 5.6 % (ref 4.8–5.6)

## 2021-09-02 ENCOUNTER — Other Ambulatory Visit: Payer: Self-pay | Admitting: Family Medicine

## 2021-09-02 ENCOUNTER — Encounter: Payer: Medicaid Other | Admitting: Obstetrics & Gynecology

## 2021-09-02 ENCOUNTER — Encounter: Payer: Self-pay | Admitting: Family Medicine

## 2021-09-02 DIAGNOSIS — R8271 Bacteriuria: Secondary | ICD-10-CM | POA: Insufficient documentation

## 2021-09-02 LAB — CULTURE, OB URINE

## 2021-09-02 LAB — URINE CULTURE, OB REFLEX

## 2021-09-02 MED ORDER — AMOXICILLIN 875 MG PO TABS: 875.0000 mg | ORAL_TABLET | Freq: Two times a day (BID) | ORAL | 0 refills | Status: AC

## 2021-10-21 ENCOUNTER — Other Ambulatory Visit: Payer: Self-pay

## 2021-10-21 ENCOUNTER — Ambulatory Visit: Payer: Medicaid Other | Attending: Family Medicine

## 2021-10-21 ENCOUNTER — Other Ambulatory Visit: Payer: Self-pay | Admitting: Family Medicine

## 2021-10-21 ENCOUNTER — Other Ambulatory Visit: Payer: Self-pay | Admitting: *Deleted

## 2021-10-21 DIAGNOSIS — Z3A18 18 weeks gestation of pregnancy: Secondary | ICD-10-CM | POA: Insufficient documentation

## 2021-10-21 DIAGNOSIS — Z3480 Encounter for supervision of other normal pregnancy, unspecified trimester: Secondary | ICD-10-CM

## 2021-10-21 DIAGNOSIS — Z362 Encounter for other antenatal screening follow-up: Secondary | ICD-10-CM

## 2021-10-21 DIAGNOSIS — O99212 Obesity complicating pregnancy, second trimester: Secondary | ICD-10-CM | POA: Diagnosis not present

## 2021-10-21 DIAGNOSIS — Z363 Encounter for antenatal screening for malformations: Secondary | ICD-10-CM | POA: Insufficient documentation

## 2021-10-23 NOTE — L&D Delivery Note (Signed)
OB/GYN Faculty Practice Delivery Note ? ?Leslie Perkins is a 21 y.o. G1P0 s/p VD at [redacted]w[redacted]d. She was admitted for SOL with gHTN identified during evaluation.  ? ?ROM: 35h 90m with Clear fluid ?GBS Status: Positive ?Maximum Maternal Temperature: 100.7 ? ?Labor Progress: ?Patient admitted for SOL after reporting SROM on 5/8 at 0200.  Patient started on pitocin for augmentation and progressed to complete.  She delivered as below with staff and SO support.  Patient with low-grade fever, that was treated with tylenol, prior to delivery.  ? ?Delivery Date/Time: Feb 28, 2022 at 1309 ?Delivery: Called to room and patient was complete and pushing. Head delivered in Direct OA and did not restitute.  Infant shoulder and body delivered via somersault as preventative measure,but no nuchal cord present. .Infant with spontaneous cry and dried and stimulated by provider before being placed on mother's abdomen. Nurse continued to dry and stimulate while also providing bulb suction. Cord clamped x 2 after 2-minute delay and cut by FOB. Cord noted to be short, blood drawn and cord sample cut. Placenta delivered spontaneously with gentle cord traction. Fundus firm with massage and Pitocin. Labia, perineum, vagina, and cervix inspected and hemostatic bilateral labial abrasions noted.  Mother hemodynamically stable and infant skin to skin prior to provider exit.   ? ?Placenta: Intact, To Pathology ?Complications: Maternal Fever, Prolonged SROM ?Lacerations: Hemostatic Bilateral Labial  ?EBL: 150 ?Analgesia: Epidural ? ?Postpartum Planning ?-Cefotetan 1g x one ?-Lasix Day 2 for 5 Days ?-BP check in one week ?-Infant Circumcision Desired ?-Declines Birth Control ? ?InfantMahala Perkins  APGARs 8,9  [id number] 6lb 5.2oz, 20.75in ? ?Cherre Robins, CNM  ?02/28/2022 1:53 PM  ?

## 2021-11-17 ENCOUNTER — Ambulatory Visit: Payer: Medicaid Other

## 2021-11-17 ENCOUNTER — Telehealth: Payer: Self-pay

## 2021-11-17 NOTE — Telephone Encounter (Signed)
Called patient with Medicaid Transportation # [phone] for 11/22/21

## 2021-11-18 DIAGNOSIS — Z3480 Encounter for supervision of other normal pregnancy, unspecified trimester: Secondary | ICD-10-CM | POA: Diagnosis not present

## 2021-11-22 ENCOUNTER — Other Ambulatory Visit: Payer: Self-pay

## 2021-11-22 ENCOUNTER — Ambulatory Visit: Payer: Medicaid Other | Admitting: *Deleted

## 2021-11-22 ENCOUNTER — Telehealth: Payer: Self-pay

## 2021-11-22 ENCOUNTER — Ambulatory Visit: Payer: Medicaid Other | Attending: Obstetrics and Gynecology

## 2021-11-22 VITALS — BP 135/70 | HR 108

## 2021-11-22 DIAGNOSIS — Z362 Encounter for other antenatal screening follow-up: Secondary | ICD-10-CM | POA: Diagnosis not present

## 2021-11-22 DIAGNOSIS — Z3480 Encounter for supervision of other normal pregnancy, unspecified trimester: Secondary | ICD-10-CM

## 2021-11-22 DIAGNOSIS — E668 Other obesity: Secondary | ICD-10-CM | POA: Diagnosis not present

## 2021-11-22 DIAGNOSIS — Z3A23 23 weeks gestation of pregnancy: Secondary | ICD-10-CM | POA: Insufficient documentation

## 2021-11-22 DIAGNOSIS — O99212 Obesity complicating pregnancy, second trimester: Secondary | ICD-10-CM | POA: Insufficient documentation

## 2021-11-22 NOTE — Telephone Encounter (Signed)
Patient called to check on Medicaid Transportation for todays appointment:  I called [employer]: verified that patient is scheduled for pick up between 12:45pm and 1:15pm - Confirmation # [phone]  Called patient back with this confirmation information.

## 2021-11-23 ENCOUNTER — Other Ambulatory Visit: Payer: Self-pay | Admitting: *Deleted

## 2021-11-23 DIAGNOSIS — Z6834 Body mass index (BMI) 34.0-34.9, adult: Secondary | ICD-10-CM

## 2022-01-17 ENCOUNTER — Ambulatory Visit: Payer: Medicaid Other | Admitting: *Deleted

## 2022-01-17 ENCOUNTER — Ambulatory Visit: Payer: Medicaid Other | Attending: Obstetrics

## 2022-01-17 ENCOUNTER — Other Ambulatory Visit: Payer: Self-pay

## 2022-01-17 ENCOUNTER — Other Ambulatory Visit: Payer: Self-pay | Admitting: *Deleted

## 2022-01-17 ENCOUNTER — Encounter: Payer: Self-pay | Admitting: *Deleted

## 2022-01-17 VITALS — BP 135/94 | HR 112

## 2022-01-17 VITALS — BP 124/85

## 2022-01-17 DIAGNOSIS — Z3A31 31 weeks gestation of pregnancy: Secondary | ICD-10-CM

## 2022-01-17 DIAGNOSIS — O99213 Obesity complicating pregnancy, third trimester: Secondary | ICD-10-CM | POA: Diagnosis not present

## 2022-01-17 DIAGNOSIS — Z3480 Encounter for supervision of other normal pregnancy, unspecified trimester: Secondary | ICD-10-CM

## 2022-01-17 DIAGNOSIS — E669 Obesity, unspecified: Secondary | ICD-10-CM

## 2022-01-17 DIAGNOSIS — Z6834 Body mass index (BMI) 34.0-34.9, adult: Secondary | ICD-10-CM | POA: Insufficient documentation

## 2022-02-21 ENCOUNTER — Ambulatory Visit: Payer: Medicaid Other | Attending: Obstetrics

## 2022-02-21 ENCOUNTER — Ambulatory Visit: Payer: Medicaid Other | Admitting: *Deleted

## 2022-02-21 VITALS — BP 133/83 | HR 105

## 2022-02-21 DIAGNOSIS — E669 Obesity, unspecified: Secondary | ICD-10-CM

## 2022-02-21 DIAGNOSIS — O99213 Obesity complicating pregnancy, third trimester: Secondary | ICD-10-CM | POA: Insufficient documentation

## 2022-02-21 DIAGNOSIS — Z3A36 36 weeks gestation of pregnancy: Secondary | ICD-10-CM | POA: Diagnosis not present

## 2022-02-21 DIAGNOSIS — Z3480 Encounter for supervision of other normal pregnancy, unspecified trimester: Secondary | ICD-10-CM | POA: Insufficient documentation

## 2022-02-22 DIAGNOSIS — O0930 Supervision of pregnancy with insufficient antenatal care, unspecified trimester: Secondary | ICD-10-CM | POA: Insufficient documentation

## 2022-02-22 NOTE — Progress Notes (Deleted)
   PRENATAL VISIT NOTE  Subjective:  Leslie Perkins is a 21 y.o. G1P0 at [redacted]w[redacted]d being seen today for ongoing prenatal care.  She is currently monitored for the following issues for this {Blank single:19197::"high-risk","low-risk"} pregnancy and has Supervision of other normal pregnancy, antepartum; GBS bacteriuria; and Limited prenatal care on their problem list.  Patient reports {sx:14538}.   .  .   . Denies leaking of fluid.   The following portions of the patient's history were reviewed and updated as appropriate: allergies, current medications, past family history, past medical history, past social history, past surgical history and problem list.   Objective:  There were no vitals filed for this visit.  Fetal Status:           General:  Alert, oriented and cooperative. Patient is in no acute distress.  Skin: Skin is warm and dry. No rash noted.   Cardiovascular: Normal heart rate noted  Respiratory: Normal respiratory effort, no problems with respiration noted  Abdomen: Soft, gravid, appropriate for gestational age.        Pelvic: {Blank single:19197::"Cervical exam performed in the presence of a chaperone","Cervical exam deferred"}        Extremities: Normal range of motion.     Mental Status: Normal mood and affect. Normal behavior. Normal judgment and thought content.   Assessment and Plan:  Pregnancy: G1P0 at [redacted]w[redacted]d 1. Limited prenatal care in third trimester -patient thought [place] appts were her OB appts -will draw GC CT today and HgbA1 -discussed birth control, breast feeding pediatrician and cird 2. Supervision of other normal pregnancy, antepartum ***  {Blank single:19197::"Term","Preterm"} labor symptoms and general obstetric precautions including but not limited to vaginal bleeding, contractions, leaking of fluid and fetal movement were reviewed in detail with the patient. Please refer to After Visit Summary for other counseling recommendations.   No follow-ups on  file.  Future Appointments  Date Time Provider Department Center  02/23/2022  1:15 PM Marylene Land, CNM [facility]    [address] [place], [place]

## 2022-02-23 ENCOUNTER — Encounter: Payer: Medicaid Other | Admitting: Student

## 2022-02-23 DIAGNOSIS — O0933 Supervision of pregnancy with insufficient antenatal care, third trimester: Secondary | ICD-10-CM

## 2022-02-23 DIAGNOSIS — Z3480 Encounter for supervision of other normal pregnancy, unspecified trimester: Secondary | ICD-10-CM

## 2022-02-28 ENCOUNTER — Other Ambulatory Visit: Payer: Self-pay

## 2022-02-28 ENCOUNTER — Inpatient Hospital Stay (HOSPITAL_COMMUNITY)
Admission: AD | Admit: 2022-02-28 | Discharge: 2022-03-02 | DRG: 806 | Disposition: A | Discharge status: Home / Self Care | Payer: Medicaid Other | Attending: Family Medicine | Admitting: Family Medicine

## 2022-02-28 ENCOUNTER — Inpatient Hospital Stay (HOSPITAL_COMMUNITY): Payer: Medicaid Other | Admitting: Anesthesiology

## 2022-02-28 ENCOUNTER — Encounter (HOSPITAL_COMMUNITY): Payer: Self-pay | Admitting: Obstetrics & Gynecology

## 2022-02-28 DIAGNOSIS — Z23 Encounter for immunization: Secondary | ICD-10-CM | POA: Diagnosis not present

## 2022-02-28 DIAGNOSIS — O139 Gestational [pregnancy-induced] hypertension without significant proteinuria, unspecified trimester: Secondary | ICD-10-CM | POA: Diagnosis present

## 2022-02-28 DIAGNOSIS — O134 Gestational [pregnancy-induced] hypertension without significant proteinuria, complicating childbirth: Principal | ICD-10-CM | POA: Diagnosis present

## 2022-02-28 DIAGNOSIS — O99214 Obesity complicating childbirth: Secondary | ICD-10-CM | POA: Diagnosis not present

## 2022-02-28 DIAGNOSIS — O99824 Streptococcus B carrier state complicating childbirth: Secondary | ICD-10-CM | POA: Diagnosis present

## 2022-02-28 DIAGNOSIS — O43893 Other placental disorders, third trimester: Secondary | ICD-10-CM | POA: Diagnosis not present

## 2022-02-28 DIAGNOSIS — O0930 Supervision of pregnancy with insufficient antenatal care, unspecified trimester: Secondary | ICD-10-CM

## 2022-02-28 DIAGNOSIS — O9081 Anemia of the puerperium: Secondary | ICD-10-CM | POA: Diagnosis not present

## 2022-02-28 DIAGNOSIS — R8271 Bacteriuria: Secondary | ICD-10-CM | POA: Diagnosis present

## 2022-02-28 DIAGNOSIS — R030 Elevated blood-pressure reading, without diagnosis of hypertension: Secondary | ICD-10-CM | POA: Diagnosis present

## 2022-02-28 DIAGNOSIS — O411230 Chorioamnionitis, third trimester, not applicable or unspecified: Secondary | ICD-10-CM | POA: Diagnosis not present

## 2022-02-28 DIAGNOSIS — O133 Gestational [pregnancy-induced] hypertension without significant proteinuria, third trimester: Principal | ICD-10-CM

## 2022-02-28 DIAGNOSIS — Z3A37 37 weeks gestation of pregnancy: Secondary | ICD-10-CM | POA: Diagnosis not present

## 2022-02-28 DIAGNOSIS — Z3A00 Weeks of gestation of pregnancy not specified: Secondary | ICD-10-CM | POA: Diagnosis not present

## 2022-02-28 LAB — CBC
HCT: 29 % — ABNORMAL LOW (ref 36.0–46.0)
HCT: 26.1 % — ABNORMAL LOW (ref 36.0–46.0)
Hemoglobin: 9.7 g/dL — ABNORMAL LOW (ref 12.0–15.0)
Hemoglobin: 8.7 g/dL — ABNORMAL LOW (ref 12.0–15.0)
MCH: 27.5 pg (ref 26.0–34.0)
MCH: 27.6 pg (ref 26.0–34.0)
MCHC: 33.4 g/dL (ref 30.0–36.0)
MCHC: 33.3 g/dL (ref 30.0–36.0)
MCV: 82.2 fL (ref 80.0–100.0)
MCV: 82.9 fL (ref 80.0–100.0)
Platelets: 372 10*3/uL (ref 150–400)
Platelets: 332 10*3/uL (ref 150–400)
RBC: 3.53 MIL/uL — ABNORMAL LOW (ref 3.87–5.11)
RBC: 3.15 MIL/uL — ABNORMAL LOW (ref 3.87–5.11)
RDW: 13.2 % (ref 11.5–15.5)
RDW: 13.2 % (ref 11.5–15.5)
WBC: 14.7 10*3/uL — ABNORMAL HIGH (ref 4.0–10.5)
WBC: 17.9 10*3/uL — ABNORMAL HIGH (ref 4.0–10.5)
nRBC: 0 % (ref 0.0–0.2)
nRBC: 0 % (ref 0.0–0.2)

## 2022-02-28 LAB — COMPREHENSIVE METABOLIC PANEL
ALT: 16 U/L (ref 0–44)
AST: 19 U/L (ref 15–41)
Albumin: 2.8 g/dL — ABNORMAL LOW (ref 3.5–5.0)
Alkaline Phosphatase: 100 U/L (ref 38–126)
Anion gap: 9 (ref 5–15)
BUN: 8 mg/dL (ref 6–20)
CO2: 20 mmol/L — ABNORMAL LOW (ref 22–32)
Calcium: 9.3 mg/dL (ref 8.9–10.3)
Chloride: 107 mmol/L (ref 98–111)
Creatinine, Ser: 0.65 mg/dL (ref 0.44–1.00)
GFR, Estimated: >60 mL/min (ref >60)
Glucose, Bld: 95 mg/dL (ref 70–99)
Potassium: 3.9 mmol/L (ref 3.5–5.1)
Sodium: 136 mmol/L (ref 135–145)
Total Bilirubin: 0.3 mg/dL (ref 0.3–1.2)
Total Protein: 6.7 g/dL (ref 6.5–8.1)

## 2022-02-28 LAB — RPR: RPR Ser Ql: NONREACTIVE

## 2022-02-28 LAB — TYPE AND SCREEN
ABO/RH(D): O POS
Antibody Screen: NEGATIVE

## 2022-02-28 MED ORDER — SOD CITRATE-CITRIC ACID 500-334 MG/5ML PO SOLN
30.0000 mL | ORAL | Status: DC | PRN
Start: 2022-02-28 — End: 2022-02-28

## 2022-02-28 MED ORDER — SIMETHICONE 80 MG PO CHEW
80.0000 mg | CHEWABLE_TABLET | ORAL | Status: DC | PRN
  Filled 2022-02-28: qty 1

## 2022-02-28 MED ORDER — SODIUM CHLORIDE 0.9% FLUSH: 3.0000 mL | INTRAVENOUS | Status: DC | PRN

## 2022-02-28 MED ORDER — DIBUCAINE (PERIANAL) 1 % EX OINT: 1.0000 "application " | TOPICAL_OINTMENT | CUTANEOUS | Status: DC | PRN

## 2022-02-28 MED ORDER — FLEET ENEMA 7-19 GM/118ML RE ENEM: 1.0000 | ENEMA | RECTAL | Status: DC | PRN

## 2022-02-28 MED ORDER — FUROSEMIDE 20 MG PO TABS
20.0000 mg | ORAL_TABLET | Freq: Every day | ORAL | Status: DC
  Administered 2022-03-01 – 2022-03-02 (×2): 20 mg via ORAL
  Filled 2022-02-28 (×2): qty 1

## 2022-02-28 MED ORDER — OXYCODONE-ACETAMINOPHEN 5-325 MG PO TABS: 2.0000 | ORAL_TABLET | ORAL | Status: DC | PRN

## 2022-02-28 MED ORDER — TETANUS-DIPHTH-ACELL PERTUSSIS 5-2.5-18.5 LF-MCG/0.5 IM SUSY
0.5000 mL | PREFILLED_SYRINGE | Freq: Once | INTRAMUSCULAR | Status: AC
  Administered 2022-03-02: 0.5 mL via INTRAMUSCULAR
  Filled 2022-02-28: qty 0.5

## 2022-02-28 MED ORDER — ONDANSETRON HCL 4 MG PO TABS: 4.0000 mg | ORAL_TABLET | ORAL | Status: DC | PRN

## 2022-02-28 MED ORDER — NIFEDIPINE ER OSMOTIC RELEASE 30 MG PO TB24
30.0000 mg | ORAL_TABLET | Freq: Every day | ORAL | Status: DC
  Administered 2022-02-28 – 2022-03-01 (×2): 30 mg via ORAL
  Filled 2022-02-28 (×3): qty 1

## 2022-02-28 MED ORDER — COCONUT OIL OIL: 1.0000 "application " | TOPICAL_OIL | Status: DC | PRN

## 2022-02-28 MED ORDER — DIPHENHYDRAMINE HCL 25 MG PO CAPS: 25.0000 mg | ORAL_CAPSULE | Freq: Four times a day (QID) | ORAL | Status: DC | PRN

## 2022-02-28 MED ORDER — EPHEDRINE 5 MG/ML INJ: 10.0000 mg | INTRAVENOUS | Status: DC | PRN

## 2022-02-28 MED ORDER — PENICILLIN G POT IN DEXTROSE 60000 UNIT/ML IV SOLN
3.0000 10*6.[IU] | INTRAVENOUS | Status: DC
  Administered 2022-02-28: 3 10*6.[IU] via INTRAVENOUS
  Filled 2022-02-28: qty 50

## 2022-02-28 MED ORDER — LIDOCAINE HCL (PF) 1 % IJ SOLN
INTRAMUSCULAR | Status: DC | PRN
  Administered 2022-02-28 (×2): 5 mL via EPIDURAL

## 2022-02-28 MED ORDER — PHENYLEPHRINE 80 MCG/ML (10ML) SYRINGE FOR IV PUSH (FOR BLOOD PRESSURE SUPPORT): 80.0000 ug | PREFILLED_SYRINGE | INTRAVENOUS | Status: DC | PRN

## 2022-02-28 MED ORDER — LACTATED RINGERS IV SOLN: 500.0000 mL | Freq: Once | INTRAVENOUS | Status: DC

## 2022-02-28 MED ORDER — ZOLPIDEM TARTRATE 5 MG PO TABS: 5.0000 mg | ORAL_TABLET | Freq: Every evening | ORAL | Status: DC | PRN

## 2022-02-28 MED ORDER — OXYTOCIN-SODIUM CHLORIDE 30-0.9 UT/500ML-% IV SOLN
2.5000 [IU]/h | INTRAVENOUS | Status: DC
  Filled 2022-02-28: qty 500

## 2022-02-28 MED ORDER — DIPHENHYDRAMINE HCL 50 MG/ML IJ SOLN: 12.5000 mg | INTRAMUSCULAR | Status: DC | PRN

## 2022-02-28 MED ORDER — FERROUS SULFATE 325 (65 FE) MG PO TABS
325.0000 mg | ORAL_TABLET | Freq: Every day | ORAL | Status: DC
  Administered 2022-03-01 – 2022-03-02 (×2): 325 mg via ORAL
  Filled 2022-02-28 (×2): qty 1

## 2022-02-28 MED ORDER — SODIUM CHLORIDE 0.9 % IV SOLN
1.0000 g | Freq: Two times a day (BID) | INTRAVENOUS | Status: DC
  Administered 2022-02-28 (×2): 1 g via INTRAVENOUS
  Filled 2022-02-28 (×5): qty 1

## 2022-02-28 MED ORDER — TERBUTALINE SULFATE 1 MG/ML IJ SOLN
0.2500 mg | Freq: Once | INTRAMUSCULAR | Status: DC | PRN
  Filled 2022-02-28: qty 1

## 2022-02-28 MED ORDER — SENNOSIDES-DOCUSATE SODIUM 8.6-50 MG PO TABS
2.0000 | ORAL_TABLET | ORAL | Status: DC
  Administered 2022-03-01 – 2022-03-02 (×2): 2 via ORAL
  Filled 2022-02-28 (×2): qty 2

## 2022-02-28 MED ORDER — SODIUM CHLORIDE 0.9% FLUSH: 3.0000 mL | Freq: Two times a day (BID) | INTRAVENOUS | Status: DC

## 2022-02-28 MED ORDER — ONDANSETRON HCL 4 MG/2ML IJ SOLN: 4.0000 mg | Freq: Four times a day (QID) | INTRAMUSCULAR | Status: DC | PRN

## 2022-02-28 MED ORDER — LIDOCAINE HCL (PF) 1 % IJ SOLN: 30.0000 mL | INTRAMUSCULAR | Status: DC | PRN

## 2022-02-28 MED ORDER — FENTANYL CITRATE (PF) 100 MCG/2ML IJ SOLN
100.0000 ug | INTRAMUSCULAR | Status: DC | PRN
  Administered 2022-02-28: 100 ug via INTRAVENOUS
  Filled 2022-02-28: qty 2

## 2022-02-28 MED ORDER — IBUPROFEN 600 MG PO TABS
600.0000 mg | ORAL_TABLET | Freq: Four times a day (QID) | ORAL | Status: DC
  Administered 2022-02-28 – 2022-03-02 (×6): 600 mg via ORAL
  Filled 2022-02-28 (×7): qty 1

## 2022-02-28 MED ORDER — ACETAMINOPHEN 500 MG PO TABS
1000.0000 mg | ORAL_TABLET | Freq: Once | ORAL | Status: AC
Start: 2022-02-28 — End: 2022-02-28
  Administered 2022-02-28: 1000 mg via ORAL
  Filled 2022-02-28: qty 2

## 2022-02-28 MED ORDER — SODIUM CHLORIDE 0.9 % IV SOLN: 250.0000 mL | INTRAVENOUS | Status: DC | PRN

## 2022-02-28 MED ORDER — PRENATAL MULTIVITAMIN CH
1.0000 | ORAL_TABLET | Freq: Every day | ORAL | Status: DC
  Administered 2022-03-01 – 2022-03-02 (×2): 1 via ORAL
  Filled 2022-02-28 (×2): qty 1

## 2022-02-28 MED ORDER — OXYTOCIN-SODIUM CHLORIDE 30-0.9 UT/500ML-% IV SOLN
1.0000 m[IU]/min | INTRAVENOUS | Status: DC
  Administered 2022-02-28: 2 m[IU]/min via INTRAVENOUS

## 2022-02-28 MED ORDER — LACTATED RINGERS IV SOLN
500.0000 mL | INTRAVENOUS | Status: DC | PRN
  Administered 2022-02-28: 1000 mL via INTRAVENOUS

## 2022-02-28 MED ORDER — ONDANSETRON HCL 4 MG/2ML IJ SOLN: 4.0000 mg | INTRAMUSCULAR | Status: DC | PRN

## 2022-02-28 MED ORDER — OXYTOCIN BOLUS FROM INFUSION
333.0000 mL | Freq: Once | INTRAVENOUS | Status: AC
Start: 2022-02-28 — End: 2022-02-28
  Administered 2022-02-28: 333 mL via INTRAVENOUS

## 2022-02-28 MED ORDER — ACETAMINOPHEN 325 MG PO TABS: 650.0000 mg | ORAL_TABLET | ORAL | Status: DC | PRN

## 2022-02-28 MED ORDER — WITCH HAZEL-GLYCERIN EX PADS: 1.0000 "application " | MEDICATED_PAD | CUTANEOUS | Status: DC | PRN

## 2022-02-28 MED ORDER — FENTANYL-BUPIVACAINE-NACL 0.5-0.125-0.9 MG/250ML-% EP SOLN
12.0000 mL/h | EPIDURAL | Status: DC | PRN
  Administered 2022-02-28: 12 mL/h via EPIDURAL
  Filled 2022-02-28: qty 250

## 2022-02-28 MED ORDER — SODIUM CHLORIDE 0.9 % IV SOLN
5.0000 10*6.[IU] | Freq: Once | INTRAVENOUS | Status: AC
  Administered 2022-02-28: 5 10*6.[IU] via INTRAVENOUS
  Filled 2022-02-28: qty 5

## 2022-02-28 MED ORDER — BENZOCAINE-MENTHOL 20-0.5 % EX AERO
1.0000 "application " | INHALATION_SPRAY | CUTANEOUS | Status: DC | PRN
  Administered 2022-02-28: 1 via TOPICAL
  Filled 2022-02-28: qty 56

## 2022-02-28 MED ORDER — OXYCODONE-ACETAMINOPHEN 5-325 MG PO TABS: 1.0000 | ORAL_TABLET | ORAL | Status: DC | PRN

## 2022-02-28 NOTE — Progress Notes (Addendum)
Patient ID: Leslie Perkins, female   DOB: August 05, 2001, 21 y.o.   MRN: [id number] ? ?Leslie Perkins ?MRN: [id number] ? ?Subjective: ?-Care assumed of 21 y.o. G1P0 at [redacted]w[redacted]d who presents for SOL. In room to meet acquaintance of patient and family.  Patient reports constant rectal pressure that is causing discomfort. ? ?Objective: ?BP 125/64   Pulse (!) 132   Temp 98.1 ?F (36.7 ?C) (Oral)   Resp 20   LMP 06/11/2021   SpO2 100%  ?No intake/output data recorded. ?No intake/output data recorded. ? ?Vitals:  ? 02/28/22 0700 02/28/22 0732 02/28/22 0801 02/28/22 0831  ?BP: 118/67 (!) 121/48 (!) 116/57 125/64  ?Pulse: (!) 105 (!) 116 (!) 126 (!) 132  ?Resp:  20    ?Temp:  98.1 ?F (36.7 ?C)    ?TempSrc:  Oral    ?SpO2:      ? ? ? ?Fetal Monitoring: ?FHT: 150 bpm, Mod Var, +Variable/Early Decels, +Accels ?UC: Q24min   ? ?Physical Exam: ?General appearance: Appears fatigued, Responds to questions/commands. ?Chest: normal rate and regular rhythm.  clear to auscultation, no wheezes, rales or rhonchi, symmetric air entry. ?Abdominal exam: Gravid. ?Extremities: Edema ?Skin exam: Warm Dry ? ?Vaginal Exam: ?SVE:   Dilation: 10 ?Effacement (%): 100 ?Station: Plus 1 ?Exam by:: J.San Lohmeyer, CNM ?Membranes: SROM at 0200 ?Internal Monitors: None ? ?Augmentation/Induction: ?Pitocin: Discontinued ?Cytotec: None ? ?Assessment:  ?IUP at 37.3 weeks ?Cat II FT  ?Second Stage ?Tachysystole  ?GHTN ? ? ?Plan: ?-BP stable ?-Nurse discontinued pitocin prior to provider arrival. Fluid bolus infusing. ?-Cervical exam as above. ?-Position change to promote further descent and resolution of Cat II tracing.  ?-Continue other mgmt as ordered ?-Anticipate SVD ? ? ?Valma Cava, CNM ?02/28/2022, 10:26 AM ? ?Addendum ?-Temp at 100.7.  Nurse to give 1g tylenol now.  Will discuss with attending regarding change or addition to antibiotic regimen and will order as appropriate.  ? ?Cherre Robins MSN, CNM ?Systems developer, [facility] [employer] ? ? ?

## 2022-02-28 NOTE — Discharge Summary (Signed)
? ?  Postpartum Discharge Summary ? ?Date of Service updated ? ?   ?Patient Name: Leslie Perkins ?DOB: January 13, 2001 ?MRN: [id number] ? ?Date of admission: 02/28/2022 ?Delivery date:02/28/2022  ?Delivering provider: EMLY, JESSICA  ?Date of discharge: 03/02/2022 ? ?Admitting diagnosis: Gestational hypertension [O13.9] ?Intrauterine pregnancy: [redacted]w[redacted]d    ?Secondary diagnosis:  Principal Problem: ?  Vaginal delivery ?Active Problems: ?  GBS bacteriuria ?  Limited prenatal care ?  Gestational hypertension ? ?Additional problems: None    ?Discharge diagnosis: Term Pregnancy Delivered and Gestational Hypertension                                              ?Post partum procedures: None ?Augmentation: Pitocin ?Complications: RYVO>59hours ? ?Hospital course: Onset of Labor With Vaginal Delivery      ?21y.o. yo G1P0 at 310w3das admitted in Active Labor on 02/28/2022. Patient had an uncomplicated labor course as follows:  ?Membrane Rupture Time/Date: 2:00 AM ,02/27/2022   ?Delivery Method:Vaginal, Spontaneous  ?Episiotomy: None  ?Lacerations:  None  ?Patient had an uncomplicated postpartum course.  She was initiated into the baby scripts program with a 5 day course of lasix. Her BP remained on the lower side, so procardia was not sent on dc. She is ambulating, tolerating a regular diet, passing flatus, and urinating well. Patient is discharged home in stable condition on 03/02/22. ? ?Newborn Data: ?Birth date:02/28/2022  ?Birth time:1:09 PM  ?Gender:Female  ?Living status:Living  ?Apgars:8 ,9  ?Weight:2870 g  ? ?Magnesium Sulfate received: No ?BMZ received: No ?Rhophylac:No ?MMR:No ?T-DaP:Given prenatally ?Flu: No ?Transfusion:No ? ?Physical exam  ?Vitals:  ? 03/01/22 0520 03/01/22 0804 03/01/22 2034 03/02/22 0600  ?BP: (!) 99/57 131/64 (!) 102/54 98/61  ?Pulse: (!) 110 94 (!) 105 95  ?Resp: 17 17 18 17   ?Temp: 98 ?F (36.7 ?C) 98 ?F (36.7 ?C) 97.9 ?F (36.6 ?C) 98 ?F (36.7 ?C)  ?TempSrc: Oral Oral Oral Oral  ?SpO2: 100%  100% 100%  ? ?General:  alert, cooperative, and no distress ?Lochia: appropriate ?Uterine Fundus: firm ?Incision: N/A ?DVT Evaluation: minimal edema bilaterally  ?Labs: ?Lab Results  ?Component Value Date  ? WBC 19.6 (H) 03/01/2022  ? HGB 8.0 (L) 03/01/2022  ? HCT 24.3 (L) 03/01/2022  ? MCV 82.7 03/01/2022  ? PLT 305 03/01/2022  ? ? ?  Latest Ref Rng & Units 02/28/2022  ?  4:08 AM  ?CMP  ?Glucose 70 - 99 mg/dL 95    ?BUN 6 - 20 mg/dL 8    ?Creatinine 0.44 - 1.00 mg/dL 0.65    ?Sodium 135 - 145 mmol/L 136    ?Potassium 3.5 - 5.1 mmol/L 3.9    ?Chloride 98 - 111 mmol/L 107    ?CO2 22 - 32 mmol/L 20    ?Calcium 8.9 - 10.3 mg/dL 9.3    ?Total Protein 6.5 - 8.1 g/dL 6.7    ?Total Bilirubin 0.3 - 1.2 mg/dL 0.3    ?Alkaline Phos 38 - 126 U/L 100    ?AST 15 - 41 U/L 19    ?ALT 0 - 44 U/L 16    ? ?Edinburgh Score: ? ?  02/28/2022  ?  4:40 PM  ?Edinburgh Postnatal Depression Scale Screening Tool  ?I have been able to laugh and see the funny side of things. 0  ?I have looked forward with enjoyment to things.  0  ?I have blamed myself unnecessarily when things went wrong. 1  ?I have been anxious or worried for no good reason. 0  ?I have felt scared or panicky for no good reason. 0  ?Things have been getting on top of me. 0  ?I have been so unhappy that I have had difficulty sleeping. 0  ?I have felt sad or miserable. 0  ?I have been so unhappy that I have been crying. 0  ?The thought of harming myself has occurred to me. 0  ?Edinburgh Postnatal Depression Scale Total 1  ? ? ? ?After visit meds:  ?Allergies as of 03/02/2022   ?No Known Allergies ?  ? ?  ?Medication List  ?  ? ?TAKE these medications   ? ?acetaminophen 325 MG tablet ?Commonly known as: Tylenol ?Take 2 tablets (650 mg total) by mouth every 4 (four) hours as needed (for pain scale < 4). ?  ?Blood Pressure Monitoring Devi ?1 each by Does not apply route once a week. ?  ?ferrous sulfate 325 (65 FE) MG tablet ?Take 1 tablet (325 mg total) by mouth every other day. ?  ?furosemide 20 MG  tablet ?Commonly known as: LASIX ?Take 1 tablet (20 mg total) by mouth daily for 3 days. ?  ?ibuprofen 600 MG tablet ?Commonly known as: ADVIL ?Take 1 tablet (600 mg total) by mouth every 6 (six) hours as needed. ?  ?multivitamin-prenatal 27-0.8 MG Tabs tablet ?Take 1 tablet by mouth daily at 12 noon. ?  ? ?  ? ? ? ?Discharge home in stable condition ?Infant Feeding: Bottle ?Infant Disposition:home with mother ?Discharge instruction: per After Visit Summary and Postpartum booklet. ?Activity: Advance as tolerated. Pelvic rest for 6 weeks.  ?Diet: routine diet ?Future Appointments: ?Future Appointments  ?Date Time Provider Sneads  ?03/07/2022  9:20 AM [facility] NURSE [facility] [place]  ?04/11/2022  9:15 AM Darlina Rumpf, CNM [facility]  ? ?Follow up Visit: Sent Feb 28, 2022 ? ? ?Please schedule this patient for a In person postpartum visit in 6 weeks with the following provider: Any provider. ?Additional Postpartum F/U:BP check 1 week  ?High risk pregnancy complicated by: HTN ?Delivery mode:  Vaginal, Spontaneous  ?Anticipated Birth Control:  Unsure ? ? ?03/02/2022 ?Patriciaann Clan, DO ? ? ? ?

## 2022-02-28 NOTE — H&P (Signed)
OBSTETRIC ADMISSION HISTORY AND PHYSICAL ? ?Leslie Perkins is a 21 y.o. female G1P0 with IUP at [redacted]w[redacted]d presenting for spontaneous onset of labor with elevated blood pressures in MAU. Had one elevated BP in the office on 01/17/22. She reports +FMs. No LOF, VB, blurry vision, headaches, peripheral edema, or RUQ pain. She plans on breastfeeding. She requests Nexplanon for birth control. ? ?Dating: By LMP --->  Estimated Date of Delivery: 03/18/22 ? ?Sono:  @[redacted]w[redacted]d , normal anatomy, cephalic presentation, EFW 2850g, 6 lb 5 oz, 44% ? ?Prenatal History/Complications: ?Very limited PNC - had initial OB visit in Nov 2022, no-showed her December appt, went to all of her MFM appointments and thought they were doing her [facility]. Was scheduled for an OB visit but no-showed that appt as well. Has not had GTT or 3rd trimester labs, has GBS bacteruria. ? ?Past Medical History: ?No past medical history on file. ? ?Past Surgical History: ?History reviewed. No pertinent surgical history. ? ?Obstetrical History: ?OB History   ? ? Gravida  ?1  ? Para  ?   ? Term  ?   ? Preterm  ?   ? AB  ?   ? Living  ?   ?  ? ? SAB  ?   ? IAB  ?   ? Ectopic  ?   ? Multiple  ?   ? Live Births  ?   ?   ?  ?  ? ?Social History: ?Social History  ? ?Socioeconomic History  ? Marital status: Single  ?  Spouse name: Not on file  ? Number of children: Not on file  ? Years of education: Not on file  ? Highest education level: Not on file  ?Occupational History  ? Not on file  ?Tobacco Use  ? Smoking status: Never  ? Smokeless tobacco: Never  ?Vaping Use  ? Vaping Use: Former  ? Quit date: 10/23/2018  ?Substance and Sexual Activity  ? Alcohol use: Not Currently  ?  Comment: not while preg  ? Drug use: Not Currently  ?  Types: Marijuana  ?  Comment: last use 2 years ago  ? Sexual activity: Yes  ?  Birth control/protection: None  ?Other Topics Concern  ? Not on file  ?Social History Narrative  ? Not on file  ? ?Social Determinants of Health  ? ?Financial Resource Strain: Not  on file  ?Food Insecurity: Not on file  ?Transportation Needs: Not on file  ?Physical Activity: Not on file  ?Stress: Not on file  ?Social Connections: Not on file  ? ?Family History: ?Family History  ?Problem Relation Age of Onset  ? Diabetes Sister   ? Cancer Maternal Grandmother   ? ?Allergies: ?No Known Allergies ? ?Medications Prior to Admission  ?Medication Sig Dispense Refill Last Dose  ? Prenatal Vit-Fe Fumarate-FA (MULTIVITAMIN-PRENATAL) 27-0.8 MG TABS tablet Take 1 tablet by mouth daily at 12 noon.   02/28/2022  ? Blood Pressure Monitoring DEVI 1 each by Does not apply route once a week. 1 each 0   ? ?Review of Systems: ?All systems reviewed and negative except as stated in HPI ? ?PE: ?Blood pressure (!) 145/88, pulse (!) 153, temperature 98.7 ?F (37.1 ?C), temperature source Oral, resp. rate 20, last menstrual period 06/11/2021, SpO2 100 %. ?General appearance: alert, cooperative, appears stated age, and mild distress ?Lungs: regular rate and effort ?Heart: regular rate  ?Abdomen: soft, non-tender ?Extremities: Homans sign is negative, no sign of DVT ?Presentation: cephalic ?EFM: 135 bpm,  moderate variability, 15x15 accels, no decels ?Toco: q3-57min ?Dilation: 1.5 ?Effacement (%): 60 ?Station: -3 ?Exam by:: Victorino Dike, RN ? ?Repeat check: [id number], cephalic ? ?Prenatal labs: ?ABO, Rh: --/--/PENDING (05/09 0407) ?Antibody: PENDING (05/09 0407) ?Rubella: 15.00 (11/08 1207) ?RPR: Non Reactive (11/08 1207)  ?HBsAg: Negative (11/08 1207)  ?HIV: Non Reactive (11/08 1207)  ?GBS:   Bacteruria ?2 hr GTT never collected ? ?Prenatal Transfer Tool  ?Maternal Diabetes: unknown ?Genetic Screening: Normal ?Maternal Ultrasounds/Referrals: Normal ?Fetal Ultrasounds or other Referrals:  Referred to [facility]  ?Maternal Substance Abuse:  No ?Significant Maternal Medications:  None ?Significant Maternal Lab Results: Group B Strep positive ? ?Results for orders placed or performed during the hospital  encounter of 02/28/22 (from the past 24 hour(s))  ?Type and screen [place]  ? Collection Time: 02/28/22  4:07 AM  ?Result Value Ref Range  ? ABO/RH(D) PENDING   ? Antibody Screen PENDING   ? Sample Expiration    ?  03/03/2022,2359 ?Performed at [facility], [place] [address] [place], [place] [address] ?  ? ?Patient Active Problem List  ? Diagnosis Date Noted  ? Limited prenatal care 02/22/2022  ? GBS bacteriuria 09/02/2021  ? Supervision of other normal pregnancy, antepartum 08/24/2021  ? ?Assessment: ?Leslie Perkins is a 21 y.o. G1P0 at [redacted]w[redacted]d here for spontaneous onset of labor with new onset gestational hypertension (no s/sx of PEC) ? ?1. Labor: progressing normally without augmentation, cervix changed from 1 to 3 ?2. FWB: Cat 1 ?3. Pain: Planning epidural ?4. GBS: Positive>PCN ordered ?5. gHTN: labs pending, none severe range ?  ?Plan: ?Admit to L&D for expectant management ?Report called to Laury Deep, CNM who accepts admission. ? ?Gabriel Carina, CNM  ?02/28/2022, 4:38 AM ? ? ? ? ?

## 2022-02-28 NOTE — MAU Note (Signed)
.  Leslie Perkins is a 21 y.o. at [redacted]w[redacted]d here in MAU reporting: ctx that starting at 0100 this morning, 8/10 in her lower abdomen and back. Reports that she is unsure if her water broke, but she had a gush a clear, watery fluid shortly after using the bathroom around 0200 on 02/27/2022. Reports good FM. Denies HA, RUQ pain, visual changes, or abnormal swelling.  ? ?Onset of complaint: 0100  ?Pain score: 8/10 ?Vitals:  ? 02/28/22 0328  ?BP: (!) 147/87  ?Pulse: (!) 113  ?Resp: 20  ?Temp: 98.7 ?F (37.1 ?C)  ?SpO2: 100%  ?   ?FHT:132 bpm ?Lab orders placed from triage: MAU labor ? ?

## 2022-02-28 NOTE — Anesthesia Preprocedure Evaluation (Addendum)
Anesthesia Evaluation  ?Patient identified by MRN, date of birth, ID band ?Patient awake ? ? ? ?Reviewed: ?Allergy & Precautions, NPO status , Patient's Chart, lab work & pertinent test results ? ?Airway ?Mallampati: II ? ?TM Distance: >3 FB ?Neck ROM: Full ? ? ? Dental ?no notable dental hx. ?(+) Dental Advisory Given ?  ?Pulmonary ?neg pulmonary ROS,  ?  ?Pulmonary exam normal ? ? ? ? ? ? ? Cardiovascular ?negative cardio ROS ?Normal cardiovascular exam ? ? ?  ?Neuro/Psych ?negative neurological ROS ?   ? GI/Hepatic ?negative GI ROS, Neg liver ROS,   ?Endo/Other  ?Morbid obesity ? Renal/GU ?negative Renal ROS  ? ?  ?Musculoskeletal ?negative musculoskeletal ROS ?(+)  ? Abdominal ?  ?Peds ? Hematology ?negative hematology ROS ?(+)   ?Anesthesia Other Findings ? ? Reproductive/Obstetrics ?(+) Pregnancy ? ?  ? ? ? ? ? ? ? ? ? ? ? ? ? ?  ?  ? ? ? ? ? ? ? ?Anesthesia Physical ?Anesthesia Plan ? ?ASA: 3 ? ?Anesthesia Plan: Epidural  ? ?Post-op Pain Management:   ? ?Induction:  ? ?PONV Risk Score and Plan:  ? ?Airway Management Planned:  ? ?Additional Equipment:  ? ?Intra-op Plan:  ? ?Post-operative Plan:  ? ?Informed Consent: I have reviewed the patients History and Physical, chart, labs and discussed the procedure including the risks, benefits and alternatives for the proposed anesthesia with the patient or authorized representative who has indicated his/her understanding and acceptance.  ? ? ? ? ? ?Plan Discussed with: Anesthesiologist ? ?Anesthesia Plan Comments:   ? ? ? ? ? ?Anesthesia Quick Evaluation ? ?

## 2022-02-28 NOTE — Anesthesia Procedure Notes (Signed)
Epidural ?Patient location during procedure: OB ?Start time: 02/28/2022 5:56 AM ?End time: 02/28/2022 6:11 AM ? ?Staffing ?Anesthesiologist: Duane Boston, MD ?Performed: anesthesiologist  ? ?Preanesthetic Checklist ?Completed: patient identified, IV checked, site marked, risks and benefits discussed, monitors and equipment checked, pre-op evaluation and timeout performed ? ?Epidural ?Patient position: sitting ?Prep: DuraPrep ?Patient monitoring: heart rate, cardiac monitor, continuous pulse ox and blood pressure ?Approach: midline ?Location: L2-L3 ?Injection technique: LOR saline ? ?Needle:  ?Needle type: Tuohy  ?Needle gauge: 17 G ?Needle length: 9 cm ?Needle insertion depth: 9 cm ?Catheter size: 20 Guage ?Catheter at skin depth: 14 cm ?Test dose: negative and Other ? ?Assessment ?Events: blood not aspirated, injection not painful, no injection resistance and negative IV test ? ?Additional Notes ?Informed consent obtained prior to proceeding including risk of failure, 1% risk of PDPH, risk of minor discomfort and bruising.  Discussed rare but serious complications including epidural abscess, permanent nerve injury, epidural hematoma.  Discussed alternatives to epidural analgesia and patient desires to proceed.  Timeout performed pre-procedure verifying patient name, procedure, and platelet count.  Patient tolerated procedure well. ? ? ? ? ?

## 2022-02-28 NOTE — Progress Notes (Signed)
LABOR PROGRESS NOTE ? ?Subjective: Patient reports discomfort despite epidural on board. Endorses some relief initially with placement with return of vaginal discomfort following. Denies other abnormal s/s, normal fetal movement. Declines frequent SVE.  ? ?Objective: ?Blood pressure (!) 121/48, pulse (!) 116, temperature 98.1 ?F (36.7 ?C), temperature source Oral, resp. rate 20, last menstrual period 06/11/2021, SpO2 100 %. ?Temp (24hrs), Avg:98.4 ?F (36.9 ?C), Min:98.1 ?F (36.7 ?C), Max:98.7 ?F (37.1 ?C) ? ? ?Fetal Heart Tones ?Baseline: 145 ?Variability: moderate ?Accelerations: present; 15x15 ?Decelerations: absent ?Overall assessment: cat 1 ? ?Contractions: 2-5 ? ?Cervical Exam: 3/70%/-2 per last SVE  ? ?ASSESSMENT AND PLAN:   ? ?IUP at [redacted]w[redacted]d in spontaneous early labor with new onset gHTN ?Limited bedside US performed to confirm cephalic fetal presentation. Possible finding of occiput posterior position.   ? ?Monitor BP for increased lability requiring treatment ?Initiate pitocin per order as tolerated by FHTs ?Discussed with bedside RN to consult anesthesia if indicated for adequate pain control ?Discussed with pt frequency of SVEs, 4-6 hours between SVE are sufficient for pt if appropriate. ? ?Electronically Signed By: ?Greggory Keen, SNM ?02/28/22 8:31 AM   ?

## 2022-02-28 NOTE — Lactation Note (Signed)
This note was copied from a baby's chart. ?Lactation Consultation Note ? ?Patient Name: Boy Valari Taylor ?Today's Date: 02/28/2022 ?  ?Age:22 hours ? ?RN, Ephriam Jenkins, stated Mom decided to formula feed only.  ? ?Maternal Data ?  ? ?Feeding ?  ? ?LATCH Score ?  ? ?  ? ?  ? ?  ? ?  ? ?  ? ? ?Lactation Tools Discussed/Used ?  ? ?Interventions ?  ? ?Discharge ?  ? ?Consult Status ?  ? ? ? ?Vonne Mcdanel  Nicholson-Springer ?02/28/2022, 6:28 PM ? ? ? ?

## 2022-03-01 ENCOUNTER — Encounter: Payer: Medicaid Other | Admitting: Obstetrics & Gynecology

## 2022-03-01 ENCOUNTER — Encounter (HOSPITAL_COMMUNITY): Payer: Self-pay | Admitting: Obstetrics & Gynecology

## 2022-03-01 LAB — CBC
HCT: 24.3 % — ABNORMAL LOW (ref 36.0–46.0)
Hemoglobin: 8 g/dL — ABNORMAL LOW (ref 12.0–15.0)
MCH: 27.2 pg (ref 26.0–34.0)
MCHC: 32.9 g/dL (ref 30.0–36.0)
MCV: 82.7 fL (ref 80.0–100.0)
Platelets: 305 10*3/uL (ref 150–400)
RBC: 2.94 MIL/uL — ABNORMAL LOW (ref 3.87–5.11)
RDW: 13.5 % (ref 11.5–15.5)
WBC: 19.6 10*3/uL — ABNORMAL HIGH (ref 4.0–10.5)
nRBC: 0 % (ref 0.0–0.2)

## 2022-03-01 NOTE — Anesthesia Postprocedure Evaluation (Signed)
Anesthesia Post Note ? ?Patient: Leslie Perkins ? ?Procedure(s) Performed: AN AD HOC LABOR EPIDURAL ? ?  ? ?Patient location during evaluation: Mother Baby ?Anesthesia Type: Epidural ?Level of consciousness: awake ?Pain management: satisfactory to patient ?Vital Signs Assessment: post-procedure vital signs reviewed and stable ?Respiratory status: spontaneous breathing ?Cardiovascular status: stable ?Anesthetic complications: no ? ? ?No notable events documented. ? ?Last Vitals:  ?Vitals:  ? 03/01/22 0520 03/01/22 0804  ?BP: (!) 99/57 131/64  ?Pulse: (!) 110 94  ?Resp: 17 17  ?Temp: 36.7 ?C 36.7 ?C  ?SpO2: 100%   ?  ?Last Pain:  ?Vitals:  ? 03/01/22 1127  ?TempSrc:   ?PainSc: 0-No pain  ? ?Pain Goal:   ? ?  ?  ?  ?  ?  ?  ?  ? ?Kayci Belleville ? ? ? ? ?

## 2022-03-01 NOTE — Progress Notes (Addendum)
POSTPARTUM PROGRESS NOTE ? ?Post Partum Day 1 ? ?Subjective: ? ?Leslie Perkins is a 21 y.o. G1P1001 s/p VD at [redacted]w[redacted]d.  She reports she is doing well. No acute events overnight. She denies any problems with ambulating, voiding or po intake. Denies nausea or vomiting.  Pain is well controlled.  Lochia is mild. ? ?Objective: ?Blood pressure (!) 99/57, pulse (!) 110, temperature 98 ?F (36.7 ?C), temperature source Oral, resp. rate 17, last menstrual period 06/11/2021, SpO2 100 %, unknown if currently breastfeeding. ? ?Physical Exam:  ?General: alert, cooperative and no distress ?Chest: no respiratory distress ?Heart:regular rate, distal pulses intact ?Uterine Fundus: firm, appropriately tender ?DVT Evaluation: No calf swelling or tenderness ?Extremities: minimal edema ?Skin: warm, dry ? ?Recent Labs  ?  02/28/22 ?1404 03/01/22 ?0530  ?HGB 8.7* 8.0*  ?HCT 26.1* 24.3*  ? ? ?Assessment/Plan: ?Leslie Perkins is a 21 y.o. G1P1001 s/p VD at [redacted]w[redacted]d  ? ?PPD#1 - Doing well ? Routine postpartum care ? ?Contraception: unsure ?Feeding: formula  ? ?Consented mom for neonatal circ, note placed into infants chart.  ? ?#Elevated BP in the setting of gestational hypertension: Several elevated Bps yesterday evening so procardia was started, however BP this morning lower-- will monitor throughout today. Cont lasix.  ? ?#Acute blood loss anemia: Hgb 8.0. No symptoms. Patient prefers oral iron, already had IV taken out d/t irritation.  ? ?#Limited PNC: SW consulted.  ? ? ?Dispo: Plan for discharge tomorrow. ? ? LOS: 1 day  ? ?Darrelyn Hillock, DO  ?OB Fellow  ?03/01/2022, 7:24 AM  ? ?  ?

## 2022-03-01 NOTE — Progress Notes (Signed)
CSW acknowledges consult and completed clinical assessment.  Clinical documentation will follow.  There are no barriers to d/c.  Kaylum Shrum Boyd-Gilyard, MSW, LCSW Clinical Social Work [phone]   

## 2022-03-02 ENCOUNTER — Other Ambulatory Visit (HOSPITAL_COMMUNITY): Payer: Self-pay

## 2022-03-02 LAB — SURGICAL PATHOLOGY

## 2022-03-02 MED ORDER — FERROUS SULFATE 325 (65 FE) MG PO TABS
325.0000 mg | ORAL_TABLET | ORAL | 0 refills | Status: AC
Start: 2022-03-02 — End: ?
  Filled 2022-03-02: qty 30, 60d supply, fill #0

## 2022-03-02 MED ORDER — IBUPROFEN 600 MG PO TABS
600.0000 mg | ORAL_TABLET | Freq: Four times a day (QID) | ORAL | 0 refills | Status: AC | PRN
Start: 2022-03-02 — End: ?
  Filled 2022-03-02: qty 60, 15d supply, fill #0

## 2022-03-02 MED ORDER — FUROSEMIDE 20 MG PO TABS
20.0000 mg | ORAL_TABLET | Freq: Every day | ORAL | 0 refills | Status: DC
  Filled 2022-03-02: qty 3, 3d supply, fill #0

## 2022-03-02 MED ORDER — ACETAMINOPHEN 325 MG PO TABS: 650.0000 mg | ORAL_TABLET | ORAL | Status: AC | PRN

## 2022-03-02 NOTE — Clinical Social Work Maternal (Signed)
?CLINICAL SOCIAL WORK MATERNAL/CHILD NOTE ? ?Patient Details  ?Name: Leslie Perkins ?MRN: [id number] ?Date of Birth: 12/09/2000 ? ?Date:  2022-07-05 ? ?Clinical Social Worker Initiating Note:  Nurse, learning disability Date/Time: Initiated:  03/02/22/0919    ? ?Child's Name:  Leslie Perkins  ? ?Biological Parents:  Mother, Father Leslie Perkins 01/05/2001)  ? ?Need for Interpreter:  None  ? ?Reason for Referral:  Current Substance Use/Substance Use During Pregnancy  , Late or No Prenatal Care    ? ?Address:  [address] ?[place] [address]  ?  ?Phone number:  [phone] (home)    ? ?Additional phone number:  ? ?Household Members/Support Persons (HM/SP):    (MOB reported that she resides with her other and brother.) ? ? ?HM/SP Name Relationship DOB or Age  ?HM/SP -1        ?HM/SP -2        ?HM/SP -3        ?HM/SP -4        ?HM/SP -5        ?HM/SP -6        ?HM/SP -7        ?HM/SP -8        ? ? ?Natural Supports (not living in the home):  Immediate Family, Parent, Spouse/significant other  ? ?Professional Supports: None  ? ?Employment: Unemployed  ? ?Type of Work:    ? ?Education:  High school graduate  ? ?Homebound arranged:   ? ?Financial Resources:  Medicaid  ? ?Other Resources:  Physicist, medical  , [place]  ? ?Cultural/Religious Considerations Which May Impact Care:  Per MOB's Face Sheet, MOB Christian. ? ?Strengths:  Home prepared for child    ? ?Psychotropic Medications:        ? ?Pediatrician:      ? ?Pediatrician List:  ? ?[place]    ?[place]    ?[facility]    ?[facility]    ?[facility]    ?[facility]    ? ? ?Pediatrician Fax Number:   ? ?Risk Factors/Current Problems:  Substance Use  , Transportation    ? ?Cognitive State:  Alert  , Insightful  , Able to Concentrate  , Linear Thinking    ? ?Mood/Affect:  Comfortable  , Interested  , Flat  , Relaxed    ? ?CSW Assessment: CSW met with MOB fin room 514 to complete an assessment for a consult for [facility] and SA use during pregnancy.   MOB was inviting, polite, and engaging.  When CSW arrived, MOB was resting and infant was asleep in bassinet. CSW inquired about [facility] and MOB stated, ?I didn't have any prenatal care appointments and all my appointments were with MFM."   CSW assessed MOB for barrier or concerns with MOB attending follow-up appointments for infant and MOB.  MOB acknowledged that transportation is a barrier.  CSW provided MOB with local resources for transportation and encouraged MOB to reach out to her Medicaid provider for transportation assistance; MOB agreed. MOB communicated that MOB feels prepared to care for infant and she has the support of FOB and MOB's family.  CSW inquired about car seat and a safe place for the infant to sleep.  MOB stated has a new car seat and crib for infant.  CSW inquired about MOB's SA hx and MOB reported her last use of marijuana was in 2019. CSW made MOB aware of hospital's limited [facility] policy and MOB was understanding and express that she was not concerned  about the results of infant's drug screens. CSW informed MOB that the infant's UDS and CDS was being monitored. CSW made MOB aware that if  results are positive for infant for any substance without an explanation, CSW will make a report to [facility] CPS. MOB understood the hospital's policy and procedures and did not have any questions or concerns; MOB denied any MH hx and declined information about PMADs.  ? ?CSW thanked MOB for meeting with CSW.  ? ?CSW Plan/Description:  No Further Intervention Required/No Barriers to Discharge, Sudden Infant Death Syndrome (SIDS) Education, Perinatal Mood and Anxiety Disorder (PMADs) Education, Other Patient/Family Education, [name], [name], Child Protective Service Report    ? ?Leslie Perkins, MSW, LCSW ?Clinical Social Work ?([phone] ? ? ?Leslie Deines D BOYD-GILYARD, LCSW ?03/02/2022, 9:22 AM ? ?

## 2022-03-07 ENCOUNTER — Telehealth: Payer: Self-pay

## 2022-03-07 ENCOUNTER — Ambulatory Visit: Payer: Medicaid Other

## 2022-03-07 NOTE — Telephone Encounter (Signed)
Called pt to follow up on missed appt today for BP check. Pt states she has BP cuff at home and has been checking daily and recording in Babyscripts. Declines in-person BP check. Was not discharged on any BP med. Babyscripts values included below.  ? ? ?

## 2022-03-08 ENCOUNTER — Telehealth (HOSPITAL_COMMUNITY): Payer: Self-pay | Admitting: *Deleted

## 2022-03-08 NOTE — Telephone Encounter (Signed)
Patient voiced no questions or concerns regarding her health at this time. EPDS=0. RN asked patient about missing her appointment with her OB for a BP check on 5/16. Patient stated, "I have Baby Scripts, so I didn't think I needed to go." This RN encouraged patient to call OB office to make sure they have the information they need. Patient verbalized understanding. Patient voiced no questions or concerns regarding infant at this time. Patient reports infant sleeps in a bassinet on his back. RN reviewed ABCs of safe sleep. Patient verbalized understanding. Patient requested RN email information on hospital's virtual Baby and Me class. Email sent. Deforest Hoyles, RN, 03/08/22, [phone]   ?

## 2022-03-09 ENCOUNTER — Encounter: Payer: Self-pay | Admitting: Family Medicine

## 2022-03-18 ENCOUNTER — Inpatient Hospital Stay (HOSPITAL_COMMUNITY): Admit: 2022-03-18 | Payer: Medicaid Other | Admitting: Obstetrics & Gynecology

## 2022-04-11 ENCOUNTER — Encounter: Payer: Self-pay | Admitting: Advanced Practice Midwife

## 2022-04-11 ENCOUNTER — Ambulatory Visit (INDEPENDENT_AMBULATORY_CARE_PROVIDER_SITE_OTHER): Payer: Medicaid Other | Admitting: Advanced Practice Midwife

## 2022-04-11 DIAGNOSIS — O133 Gestational [pregnancy-induced] hypertension without significant proteinuria, third trimester: Secondary | ICD-10-CM

## 2022-04-11 DIAGNOSIS — Z3009 Encounter for other general counseling and advice on contraception: Secondary | ICD-10-CM

## 2022-04-11 NOTE — Progress Notes (Signed)
Post Partum Visit Note  Leslie Perkins is a 21 y.o. [id number] female who presents for a postpartum visit. She is 6 weeks postpartum following a normal spontaneous vaginal delivery.  I have fully reviewed the prenatal and intrapartum course. The delivery was at 37w 3d gestational weeks.  Anesthesia: epidural. Postpartum course has been unremarkable. Baby is doing well. Baby is feeding by bottle Rush Barer . Bleeding no bleeding. Bowel function is normal. Bladder function is normal. Patient is not sexually active. Contraception method is none. Postpartum depression screening: negative.   The pregnancy intention screening data noted above was reviewed. Potential methods of contraception were discussed. The patient states she is not sexually active and declines both pap and contraception initiation today.   Edinburgh Postnatal Depression Scale - 04/11/22 0903       Edinburgh Postnatal Depression Scale:  In the Past 7 Days   I have been able to laugh and see the funny side of things. 0    I have looked forward with enjoyment to things. 0    I have blamed myself unnecessarily when things went wrong. 3    I have been anxious or worried for no good reason. 0    I have felt scared or panicky for no good reason. 0    Things have been getting on top of me. 0    I have been so unhappy that I have had difficulty sleeping. 0    I have felt sad or miserable. 0    I have been so unhappy that I have been crying. 0    The thought of harming myself has occurred to me. 0    Edinburgh Postnatal Depression Scale Total 3             Health Maintenance Due  Topic Date Due   COVID-19 Vaccine (1) Never done   HPV VACCINES (1 - 2-dose series) Never done   PAP-Cervical Cytology Screening  Never done   PAP SMEAR-Modifier  Never done   CHLAMYDIA SCREENING  02/24/2022    The following portions of the patient's history were reviewed and updated as appropriate: allergies, current medications, past family  history, past medical history, past social history, past surgical history, and problem list.  Review of Systems A comprehensive review of systems was negative.  Objective:  Wt (!) 302 lb 3.2 oz (137.1 kg)   LMP 04/05/2022 (Approximate)   BMI 45.95 kg/m    General:  alert, cooperative, appears stated age, and no distress   Breasts:  Declined by patient  Lungs: clear to auscultation bilaterally  Heart:  normal apical impulse  Abdomen: soft, non-tender; bowel sounds normal; no masses,  no organomegaly   GU exam:  not indicated       Assessment:   Normal postpartum exam Formula feeing Hx GHTN, normotensive and asymptomatic 4.   Declines off of pap today 5.   Declines contraception   Plan:   Essential components of care per ACOG recommendations:  1.  Mood and well being: Patient with negative depression screening today. Reviewed local resources for support.  - Patient tobacco use? No.   - hx of drug use? No.    2. Infant care and feeding:  -Patient currently breastmilk feeding? No.  -Social determinants of health (SDOH) reviewed in EPIC. No concerns  3. Sexuality, contraception and birth spacing - Patient does not want a pregnancy in the next year.   - Reviewed reproductive life planning. Reviewed contraceptive methods based on  pt preferences and effectiveness. - Patient declines contraception initiation - Discussed birth spacing of 18 months  4. Sleep and fatigue -Encouraged family/partner/community support of 4 hrs of uninterrupted sleep to help with mood and fatigue  5. Physical Recovery  - Discussed patients delivery and complications. She describes her labor as good. - Patient had a Vaginal, no problems at delivery. Patient had a  bilateral labial  lacerations (hemostatic). Perineal healing reviewed. Patient expressed understanding - Patient has urinary incontinence? No. - Patient is safe to resume physical and sexual activity  6.  Health Maintenance - No hx of  pap. Pap smear not done at today's visit (declined by patient) -Breast Cancer screening indicated? No.   7. Chronic Disease/Pregnancy Condition follow up: N/A  - PCP follow up  Clayton Bibles, MSA, MSN, CNM Certified Nurse Midwife, [facility] for [employer], [facility]

## 2022-04-11 NOTE — Patient Instructions (Signed)
[email or web address]

## 2022-09-25 IMAGING — US US MFM OB FOLLOW-UP
1 series · 13 of 28 positions shown · non-contrast
Comparison: none

[Series 1: us mfm ob follow-up · 76 acquisitions, 13 frames shown]
[im 3/76]
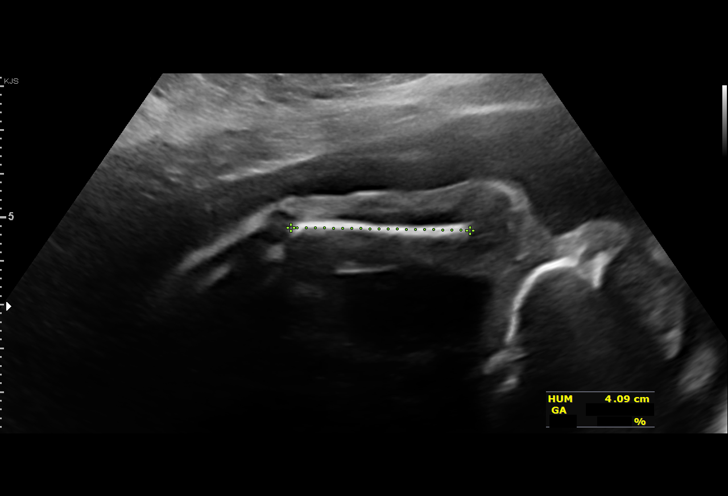
[im 9/76]
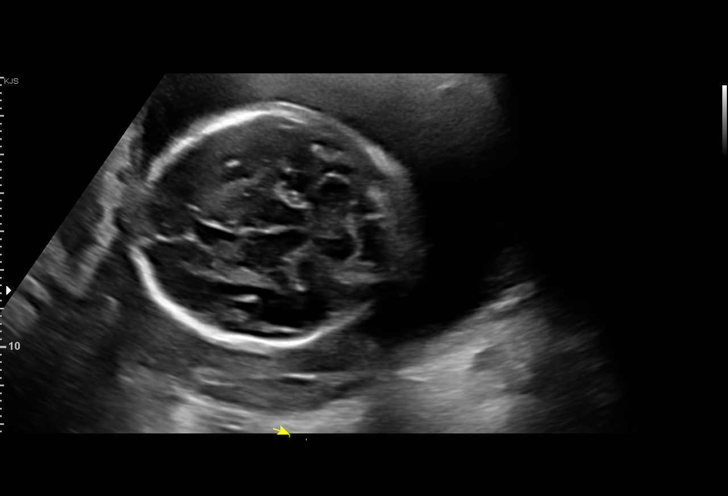
[im 14/76]
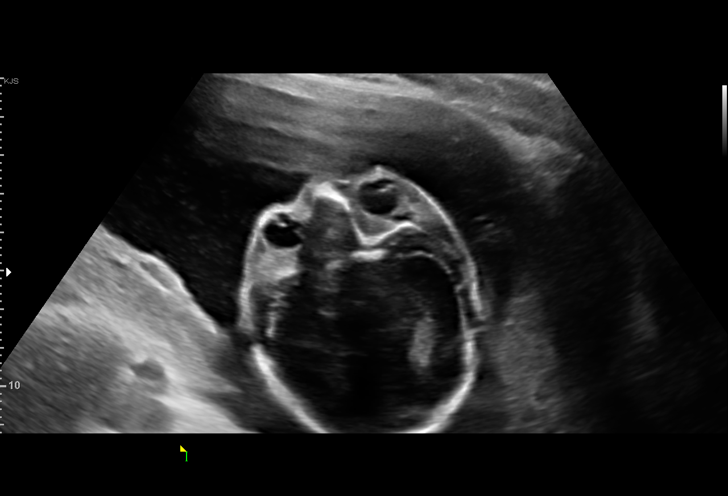
[im 20/76]
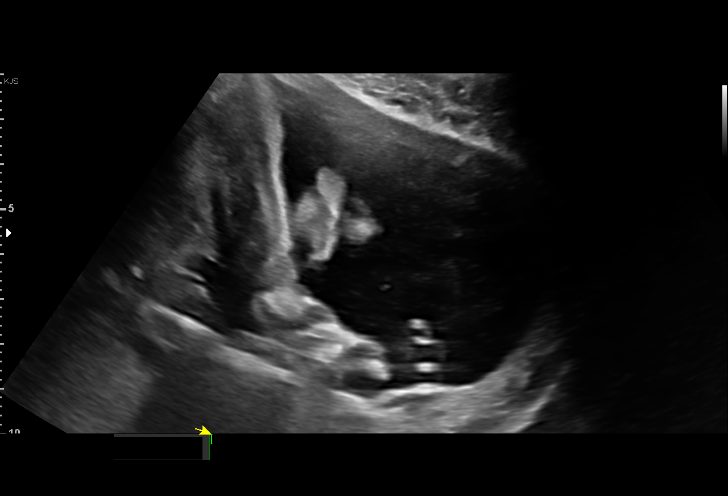
[im 26/76]
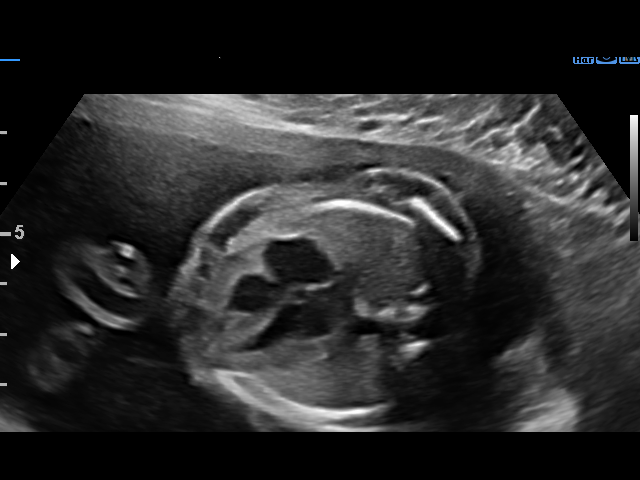
[im 31/76]
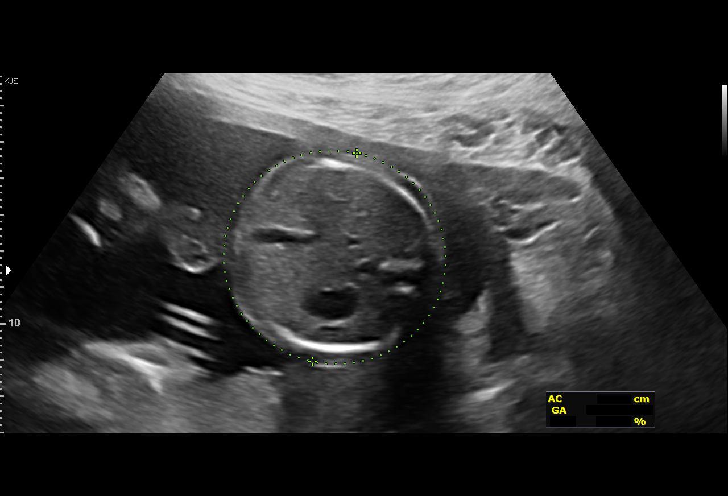
[im 39/76]
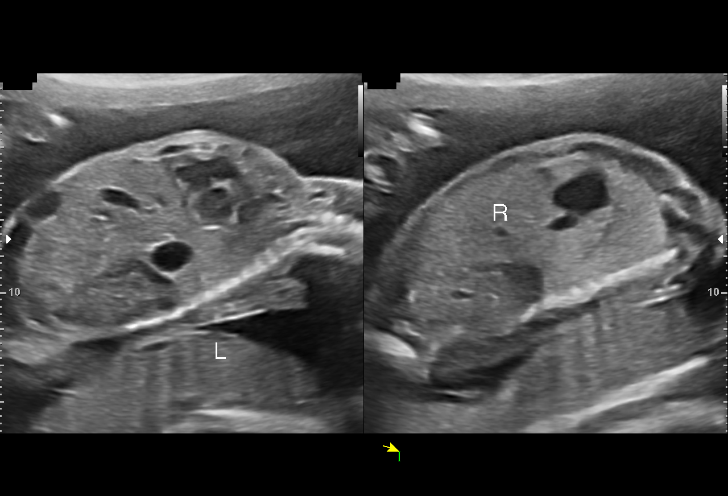
[im 45/76]
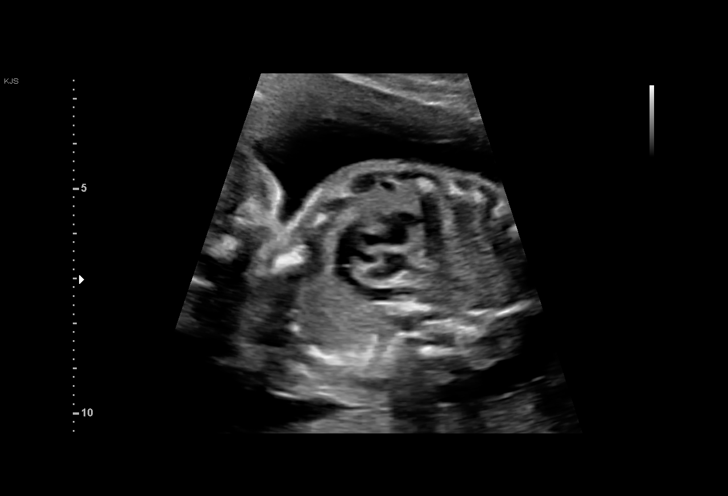
[im 51/76]
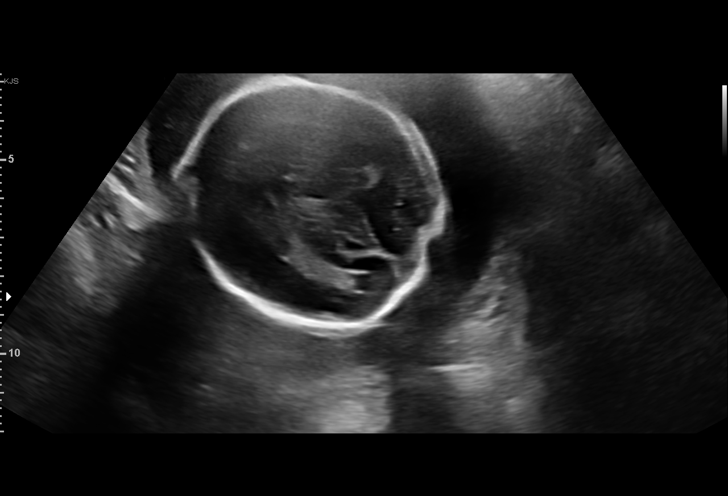
[im 56/76]
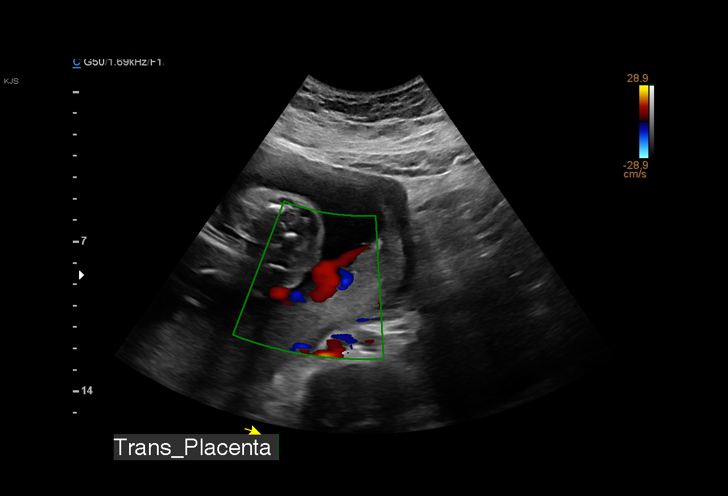
[im 62/76]
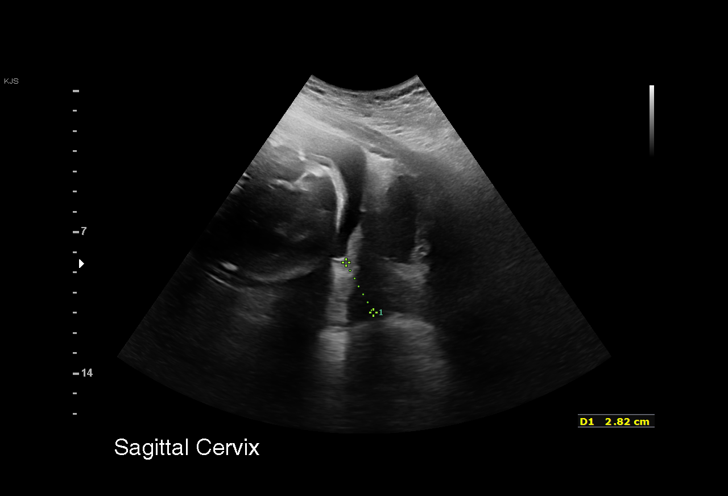
[im 67/76]
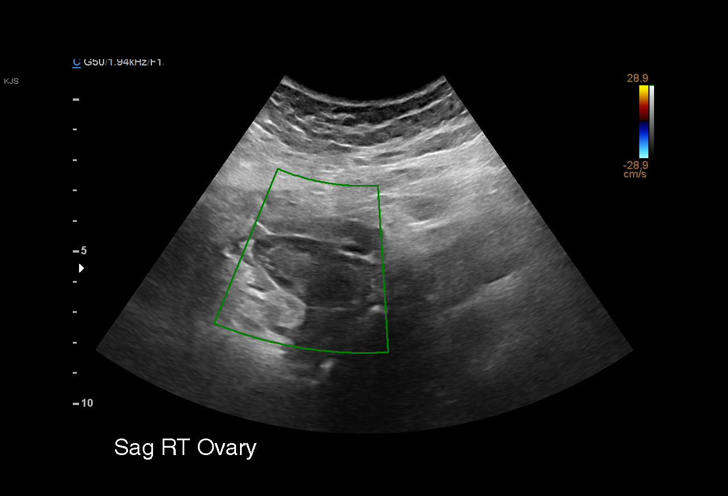
[im 73/76]
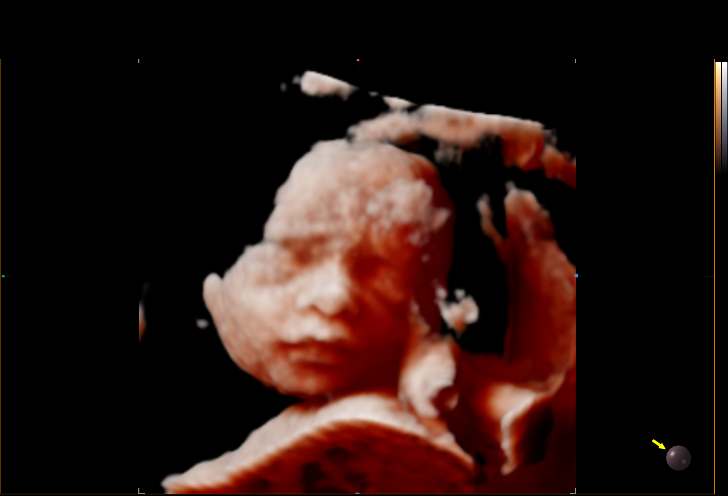

[13 of 28 positions shown; findings below may reference images not displayed]

Indications

 Obesity complicating pregnancy, second
 trimester (pregravid BMI 34)
 Antenatal follow-up for nonvisualized fetal
 anatomy
 23 weeks gestation of pregnancy
 Low risk NIPS, neg AFP
Fetal Evaluation

 Num Of Fetuses:         1
 Fetal Heart Rate(bpm):  157
 Cardiac Activity:       Observed
 Presentation:           Cephalic
 Placenta:               Posterior
 P. Cord Insertion:      Visualized, central

 Amniotic Fluid
 AFI FV:      Within normal limits

                             Largest Pocket(cm)

Biometry

 BPD:      58.9  mm     G. Age:  24w 1d         69  %    CI:        78.28   %    70 - 86
                                                         FL/HC:      20.0   %    19.2 -
 HC:      210.6  mm     G. Age:  23w 1d         23  %    HC/AC:      1.13        1.05 -
 AC:      186.4  mm     G. Age:  23w 3d         41  %    FL/BPD:     71.5   %    71 - 87
 FL:       42.1  mm     G. Age:  23w 5d         48  %    FL/AC:      22.6   %    20 - 24
 HUM:        41  mm     G. Age:  24w 6d         74  %
 CER:      24.9  mm     G. Age:  22w 5d         51  %

 LV:        4.5  mm
 CM:        4.7  mm

 Est. FW:     604  gm      1 lb 5 oz     47  %
OB History

 Gravidity:    1         Term:   0        Prem:   0        SAB:   0
 TOP:          0       Ectopic:  0        Living: 0
Gestational Age

 LMP:           23w 3d        Date:  06/11/21                 EDD:   03/18/22
 U/S Today:     23w 4d                                        EDD:   03/17/22
 Best:          23w 3d     Det. By:  LMP  (06/11/21)          EDD:   03/18/22
Anatomy

 Cranium:               Appears normal         LVOT:                   Appears normal
 Cavum:                 Appears normal         Aortic Arch:            Appears normal
 Ventricles:            Appears normal         Ductal Arch:            Appears normal
 Choroid Plexus:        Previously seen        Diaphragm:              Appears normal
 Cerebellum:            Appears normal         Stomach:                Appears normal, left
                                                                       sided
 Posterior Fossa:       Appears normal         Abdomen:                Appears normal
 Nuchal Fold:           Not applicable (>20    Abdominal Wall:         Previously seen
                        wks GA)
 Face:                  Orbits and profile     Cord Vessels:           Previously seen
                        previously seen
 Lips:                  Appears normal         Kidneys:                Appear normal
 Palate:                Appears normal         Bladder:                Appears normal
 Thoracic:              Appears normal         Spine:                  Previously seen
 Heart:                 Appears normal         Upper Extremities:      Previously seen
                        (4CH, axis, and
                        situs)
 RVOT:                  Appears normal         Lower Extremities:      Previously seen

 Other:  Lenses, nasal bone, maxilla, mandible, VC, heels/feet and open
         hands/5th digits previously visualized. Fetus appears to be a male.
         Technically difficult due to maternal habitus and fetal position.
Cervix Uterus Adnexa

 Cervix
 Length:           3.45  cm.
 Normal appearance by transabdominal scan.

 Uterus
 No abnormality visualized.

 Right Ovary
 Within normal limits.

 Left Ovary
 Within normal limits.
 Cul De Sac
 No free fluid seen.

 Adnexa
 No adnexal mass visualized.
Comments

 This patient was seen for a growth scan due to maternal
 obesity with a BMI of 35.  She denies any problems since her
 last exam.
 She was informed that the fetal growth and amniotic fluid
 level appears appropriate for her gestational age.
 Due to maternal obesity, a follow-up growth scan was
 scheduled in 8 weeks.

## 2022-12-17 ENCOUNTER — Ambulatory Visit (HOSPITAL_COMMUNITY)
Admission: RE | Admit: 2022-12-17 | Discharge: 2022-12-17 | Disposition: A | Discharge status: Home / Self Care | Payer: Medicaid Other | Source: Ambulatory Visit | Attending: Physician Assistant | Admitting: Physician Assistant

## 2022-12-17 ENCOUNTER — Encounter (HOSPITAL_COMMUNITY): Payer: Self-pay

## 2022-12-17 VITALS — BP 137/83 | HR 112 | Temp 98.4°F | Resp 16 | Ht 68.0 in

## 2022-12-17 DIAGNOSIS — Z113 Encounter for screening for infections with a predominantly sexual mode of transmission: Secondary | ICD-10-CM | POA: Insufficient documentation

## 2022-12-17 NOTE — Discharge Instructions (Addendum)
Lab testing will be completed in 48 hours.  If you do not get a call from this office in 48 hours that indicates the test is negative.  Log onto MyChart to be the test results with the post in 48 hours.  Advised follow-up PCP or return to urgent care as needed.

## 2022-12-17 NOTE — ED Triage Notes (Signed)
Patient here today for STD testing. Patient is not having any symptoms. She last tested over a year ago and wanted to make sure.

## 2022-12-17 NOTE — ED Provider Notes (Signed)
[place]    CSN: [id number] Arrival date & time: 12/17/22  1136      History   Chief Complaint No chief complaint on file.   HPI Leslie Perkins is a 22 y.o. female.   22 year old female is currently STI testing.  Patient indicates she desires to have STI testing.  She indicates that she is doing well, she has not have any vaginal symptoms.  She indicates she just wants to be cautious.  Patient indicates that her last menses was November 27, 2022, and normal.  Patient declines to have HIV and RPR testing today.     History reviewed. No pertinent past medical history.  Patient Active Problem List   Diagnosis Date Noted   Gestational hypertension 02/28/2022   Limited prenatal care 02/22/2022   GBS bacteriuria 09/02/2021    History reviewed. No pertinent surgical history.  OB History     Gravida  1   Para  1   Term  1   Preterm      AB      Living  1      SAB      IAB      Ectopic      Multiple  0   Live Births  1            Home Medications    Prior to Admission medications   Medication Sig Start Date End Date Taking? Authorizing Provider  acetaminophen (TYLENOL) 325 MG tablet Take 2 tablets (650 mg total) by mouth every 4 (four) hours as needed (for pain scale < 4). Patient not taking: Reported on 04/11/2022 03/02/22   Patriciaann Clan, DO  Blood Pressure Monitoring DEVI 1 each by Does not apply route once a week. 08/24/21   Clarnce Flock, MD  ferrous sulfate 325 (65 FE) MG tablet Take 1 tablet (325 mg total) by mouth every other day. Patient not taking: Reported on 04/11/2022 03/02/22   Patriciaann Clan, DO  ibuprofen (ADVIL) 600 MG tablet Take 1 tablet (600 mg total) by mouth every 6 (six) hours as needed. Patient not taking: Reported on 04/11/2022 03/02/22   Patriciaann Clan, DO  Prenatal Vit-Fe Fumarate-FA (MULTIVITAMIN-PRENATAL) 27-0.8 MG TABS tablet Take 1 tablet by mouth daily at 12 noon.    [provider]     Family History Family History  Problem Relation Age of Onset   Diabetes Sister    Cancer Maternal Grandmother     Social History Social History   Tobacco Use   Smoking status: Never   Smokeless tobacco: Never  Vaping Use   Vaping Use: Former   Quit date: 10/23/2018  Substance Use Topics   Alcohol use: Yes    Comment: Special occasions   Drug use: Not Currently    Types: Marijuana    Comment: last use 2 years ago     Allergies   Patient has no known allergies.   Review of Systems Review of Systems   Physical Exam Triage Vital Signs ED Triage Vitals  Enc Vitals Group     BP 12/17/22 1157 137/83     Pulse Rate 12/17/22 1157 (!) 112     Resp 12/17/22 1157 16     Temp 12/17/22 1157 98.4 F (36.9 C)     Temp Source 12/17/22 1157 Oral     SpO2 12/17/22 1157 96 %     Weight --      Height 12/17/22 1200 '5\' 8"'$  (1.727 m)  Head Circumference --      Peak Flow --      Pain Score 12/17/22 1200 0     Pain Loc --      Pain Edu? --      Excl. in [place]? --    No data found.  Updated Vital Signs BP 137/83 (BP Location: Left Arm)   Pulse (!) 112   Temp 98.4 F (36.9 C) (Oral)   Resp 16   Ht '5\' 8"'$  (1.727 m)   LMP 11/26/2022 (Approximate)   SpO2 96%   Breastfeeding No   BMI 45.95 kg/m   Visual Acuity Right Eye Distance:   Left Eye Distance:   Bilateral Distance:    Right Eye Near:   Left Eye Near:    Bilateral Near:     Physical Exam Constitutional:      Appearance: Normal appearance.  Neurological:     Mental Status: She is alert.      UC Treatments / Results  Labs (all labs ordered are listed, but only abnormal results are displayed) Labs Reviewed  CERVICOVAGINAL ANCILLARY ONLY    EKG   Radiology No results found.  Procedures Procedures (including critical care time)  Medications Ordered in UC Medications - No data to display  Initial Impression / Assessment and Plan / UC Course  I have reviewed the triage vital signs and the  nursing notes.  Pertinent labs & imaging results that were available during my care of the patient were reviewed by me and considered in my medical decision making (see chart for details).    Plan: The diagnosis will be treated with the following: 1.  Screening for STI: A.  Treatment may be initiated depending on results of the STI screening lab results. 2.  Advised follow-up PCP return to urgent care as needed. Final Clinical Impressions(s) / UC Diagnoses   Final diagnoses:  Routine screening for STI (sexually transmitted infection)     Discharge Instructions      Lab testing will be completed in 48 hours.  If you do not get a call from this office in 48 hours that indicates the test is negative.  Log onto MyChart to be the test results with the post in 48 hours.  Advised follow-up PCP or return to urgent care as needed.     ED Prescriptions   None    PDMP not reviewed this encounter.   Nyoka Lint, PA-C 12/17/22 1222

## 2022-12-18 ENCOUNTER — Telehealth (HOSPITAL_COMMUNITY): Payer: Self-pay | Admitting: Emergency Medicine

## 2022-12-18 LAB — CERVICOVAGINAL ANCILLARY ONLY
Bacterial Vaginitis (gardnerella): POSITIVE — AB
Candida Glabrata: NEGATIVE
Candida Vaginitis: NEGATIVE
Chlamydia: POSITIVE — AB
Comment: NEGATIVE
Comment: NEGATIVE
Comment: NEGATIVE
Comment: NEGATIVE
Comment: NEGATIVE
Comment: NORMAL
Neisseria Gonorrhea: NEGATIVE
Trichomonas: NEGATIVE

## 2022-12-18 MED ORDER — METRONIDAZOLE 0.75 % VA GEL: 1.0000 | Freq: Every day | VAGINAL | 0 refills | Status: AC

## 2022-12-18 MED ORDER — DOXYCYCLINE HYCLATE 100 MG PO CAPS: 100.0000 mg | ORAL_CAPSULE | Freq: Two times a day (BID) | ORAL | 0 refills | Status: AC

## 2023-10-10 ENCOUNTER — Other Ambulatory Visit: Payer: Self-pay

## 2023-10-10 ENCOUNTER — Encounter (HOSPITAL_COMMUNITY): Payer: Self-pay

## 2023-10-10 ENCOUNTER — Emergency Department (HOSPITAL_COMMUNITY)
Admission: EM | Admit: 2023-10-10 | Discharge: 2023-10-10 | Disposition: A | Discharge status: Home / Self Care | Payer: Medicaid Other | Attending: Emergency Medicine | Admitting: Emergency Medicine

## 2023-10-10 ENCOUNTER — Emergency Department (HOSPITAL_COMMUNITY): Payer: Medicaid Other

## 2023-10-10 DIAGNOSIS — S99912A Unspecified injury of left ankle, initial encounter: Secondary | ICD-10-CM | POA: Diagnosis present

## 2023-10-10 DIAGNOSIS — S93402A Sprain of unspecified ligament of left ankle, initial encounter: Secondary | ICD-10-CM | POA: Diagnosis not present

## 2023-10-10 DIAGNOSIS — M25572 Pain in left ankle and joints of left foot: Secondary | ICD-10-CM | POA: Diagnosis not present

## 2023-10-10 DIAGNOSIS — X501XXA Overexertion from prolonged static or awkward postures, initial encounter: Secondary | ICD-10-CM | POA: Diagnosis not present

## 2023-10-10 DIAGNOSIS — Z043 Encounter for examination and observation following other accident: Secondary | ICD-10-CM | POA: Diagnosis not present

## 2023-10-10 MED ORDER — ACETAMINOPHEN 325 MG PO TABS
650.0000 mg | ORAL_TABLET | Freq: Once | ORAL | Status: AC
  Administered 2023-10-10: 650 mg via ORAL
  Filled 2023-10-10: qty 2

## 2023-10-10 NOTE — ED Triage Notes (Signed)
Pt states she fell last night and twisted her ankle and went to sleep bc she was tired. Pt woke up and states she is having difficulty walking. Axox4. Denies hitting head.

## 2023-10-10 NOTE — ED Notes (Signed)
Patient is upset that she has been here for 9 hours and I am not assisting her on getting out, advised that she just returned from xray and that she wouldn't be up for discharge until her xray was read, patient requested water, it was provided.

## 2023-10-10 NOTE — Progress Notes (Signed)
Orthopedic Tech Progress Note Patient Details:  Leslie Perkins 2001/10/22 [id number]  Ortho Devices Type of Ortho Device: Crutches, ASO Ortho Device/Splint Location: LLE Ortho Device/Splint Interventions: Ordered, Application, Adjustment   Post Interventions Patient Tolerated: Well, Ambulated well Instructions Provided: Adjustment of device, Care of device, Poper ambulation with device  Diannia Ruder 10/10/2023, 7:39 PM

## 2023-10-10 NOTE — Discharge Instructions (Signed)
May take Tylenol Motrin for pain, ice and elevate the ankle  Follow-up with orthopedics if you continue to have symptoms  Return for new worsening symptoms such as fever, redness, warmth over the extremity

## 2023-10-10 NOTE — ED Provider Triage Note (Signed)
Emergency Medicine Provider Triage Evaluation Note  Leslie Perkins , a 22 y.o. female  was evaluated in triage.  Pt complains of left ankle pain after a slip and mechanical fall that occurred last night.  She has pain on the lateral aspect of the ankle.  Patient having difficulty bearing weight secondary to pain.  She denies hitting her head or losing consciousness.  Review of Systems  Positive:  Negative: See above   Physical Exam  BP 125/77 (BP Location: Left Arm)   Pulse 98   Temp 99 F (37.2 C) (Oral)   Resp 18   Ht 5\' 8"  (1.727 m)   Wt 102.1 kg   LMP 09/21/2023 (Approximate)   SpO2 99%   BMI 34.21 kg/m  Gen:   Awake, no distress   Resp:  Normal effort  MSK:   Moves extremities without difficulty  Other:  Tenderness over the left ATFL.  2+ dorsalis pedis pulse felt on the left.  Sensation intact.  Medical Decision Making  Medically screening exam initiated at 1:08 PM.  Appropriate orders placed.  Tanyia Hyre was informed that the remainder of the evaluation will be completed by another provider, this initial triage assessment does not replace that evaluation, and the importance of remaining in the ED until their evaluation is complete.     Honor Loh [place], [place] 10/10/23 1309

## 2023-10-10 NOTE — ED Provider Notes (Signed)
[facility] EMERGENCY DEPARTMENT AT [facility] Provider Note   CSN: [id number] Arrival date & time: 10/10/23  1219    History  Chief Complaint  Patient presents with   Ankle Pain    Leslie Perkins is a 22 y.o. female here for evaluation of left ankle pain.  States she inverted her left ankle yesterday evening.  Has had pain to her ankle since.  Has been unable to walk due to the pain.  Not taking medication at home.  Denies any head injury, LOC.  No numbness or weakness.  Some swelling to the lateral aspect of her left ankle.  HPI     Home Medications Prior to Admission medications   Medication Sig Start Date End Date Taking? Authorizing Provider  acetaminophen (TYLENOL) 325 MG tablet Take 2 tablets (650 mg total) by mouth every 4 (four) hours as needed (for pain scale < 4). Patient not taking: Reported on 04/11/2022 03/02/22   Allayne Stack, DO  Blood Pressure Monitoring DEVI 1 each by Does not apply route once a week. 08/24/21   Venora Maples, MD  ferrous sulfate 325 (65 FE) MG tablet Take 1 tablet (325 mg total) by mouth every other day. Patient not taking: Reported on 04/11/2022 03/02/22   Allayne Stack, DO  ibuprofen (ADVIL) 600 MG tablet Take 1 tablet (600 mg total) by mouth every 6 (six) hours as needed. Patient not taking: Reported on 04/11/2022 03/02/22   Allayne Stack, DO  Prenatal Vit-Fe Fumarate-FA (MULTIVITAMIN-PRENATAL) 27-0.8 MG TABS tablet Take 1 tablet by mouth daily at 12 noon.    [provider]      Allergies    Patient has no known allergies.    Review of Systems   Review of Systems  Constitutional: Negative.   HENT: Negative.    Respiratory: Negative.    Cardiovascular: Negative.   Gastrointestinal: Negative.   Genitourinary: Negative.   Musculoskeletal:        Left ankle pain  Skin: Negative.   Neurological: Negative.   All other systems reviewed and are negative.   Physical Exam Updated Vital Signs BP 115/81 (BP  Location: Left Arm)   Pulse 98   Temp 98.1 F (36.7 C)   Resp 16   Ht 5\' 8"  (1.727 m)   Wt 102.1 kg   LMP 09/21/2023 (Approximate)   SpO2 99%   BMI 34.21 kg/m  Physical Exam Vitals and nursing note reviewed.  Constitutional:      General: She is not in acute distress.    Appearance: She is well-developed. She is not ill-appearing, toxic-appearing or diaphoretic.  HENT:     Head: Atraumatic.  Eyes:     Pupils: Pupils are equal, round, and reactive to light.  Cardiovascular:     Rate and Rhythm: Normal rate.     Pulses:          Dorsalis pedis pulses are 2+ on the left side.       Posterior tibial pulses are 2+ on the left side.  Pulmonary:     Effort: No respiratory distress.  Abdominal:     General: There is no distension.  Musculoskeletal:        General: Swelling, tenderness and signs of injury present. No deformity. Normal range of motion.     Cervical back: Normal range of motion.     Right lower leg: No edema.     Left lower leg: No edema.     Comments: Diffuse  tenderness over left ATFL.  No bony tenderness over foot.  She has some mild tenderness to her proximal fib head.  Able to flex and extend.  Her compartments are soft.  Wiggles toes without difficulty  Skin:    General: Skin is warm and dry.     Capillary Refill: Capillary refill takes less than 2 seconds.     Comments: No ecchymosis, erythema, warmth.  She has a mild soft tissue swelling to her left lateral malleolus  Neurological:     General: No focal deficit present.     Mental Status: She is alert.     Motor: No weakness.  Psychiatric:        Mood and Affect: Mood normal.     ED Results / Procedures / Treatments   Labs (all labs ordered are listed, but only abnormal results are displayed) Labs Reviewed - No data to display  EKG None  Radiology DG Tibia/Fibula Left Result Date: 10/10/2023 CLINICAL DATA:  Fall EXAM: LEFT TIBIA AND FIBULA - 2 VIEW COMPARISON:  Ankle radiograph 10/10/2023  FINDINGS: There is no evidence of fracture or other focal bone lesions. Soft tissues are unremarkable. IMPRESSION: Negative. Electronically Signed   By: Jasmine Pang M.D.   On: 10/10/2023 19:31   DG Ankle Complete Left Result Date: 10/10/2023 CLINICAL DATA:  Left ankle pain after fall last night. EXAM: LEFT ANKLE COMPLETE - 3+ VIEW COMPARISON:  None Available. FINDINGS: There is no evidence of fracture, dislocation, or joint effusion. There is no evidence of arthropathy or other focal bone abnormality. Soft tissues are unremarkable. IMPRESSION: Negative. Electronically Signed   By: Lupita Raider M.D.   On: 10/10/2023 14:39    Procedures .Ortho Injury Treatment  Date/Time: 10/10/2023 11:31 PM  Performed by: Ralph Leyden A, PA-C Authorized by: Linwood Dibbles, PA-C   Consent:    Consent obtained:  Verbal   Consent given by:  Patient   Risks discussed:  Fracture, nerve damage, restricted joint movement, vascular damage, stiffness, recurrent dislocation and irreducible dislocation   Alternatives discussed:  No treatment, alternative treatment, immobilization, referral and delayed treatmentInjury location: ankle Location details: left ankle Injury type: soft tissue Pre-procedure neurovascular assessment: neurovascularly intact Pre-procedure distal perfusion: normal Pre-procedure neurological function: normal Pre-procedure range of motion: normal  Anesthesia: Local anesthesia used: no  Patient sedated: NoImmobilization: crutches and brace Splint type: aso brace. Splint Applied by: Milon Dikes Post-procedure neurovascular assessment: post-procedure neurovascularly intact Post-procedure distal perfusion: normal Post-procedure neurological function: normal Post-procedure range of motion: normal       Medications Ordered in ED Medications  acetaminophen (TYLENOL) tablet 650 mg (650 mg Oral Given 10/10/23 1827)   ED Course/ Medical Decision Making/ A&P    22 year old  with mechanical fall which occurred yesterday she has pain to her left lateral malleolus and her proximal fib head.  She is neurovascularly intact.  No erythema, warmth, ecchymosis.  Her compartments are soft.  Difficulty with pressure on leg due to pain.  Low suspicion for compartment syndrome.  No evidence of open fractures, will get imaging, pain control  Imaging personally viewed and interpreted:  X-ray left ankle without acute fracture, dislocation X-ray left tib-fib without fracture, dislocation  Suspect sprain or strain.  She was placed in ASO brace, crutches.  Low suspicion for occult fracture, dislocation, compartment syndrome, VTE, arterial occlusion  She was upset that she has been in the emergency department for 7 hours.  I apologized for the wait however she had extended wait  out in the lobby as well as waiting for radiology reading of her imaging.  The patient has been appropriately medically screened and/or stabilized in the ED. I have low suspicion for any other emergent medical condition which would require further screening, evaluation or treatment in the ED or require inpatient management.  Patient is hemodynamically stable and in no acute distress.  Patient able to ambulate in department prior to ED.  Evaluation does not show acute pathology that would require ongoing or additional emergent interventions while in the emergency department or further inpatient treatment.  I have discussed the diagnosis with the patient and answered all questions.  Pain is been managed while in the emergency department and patient has no further complaints prior to discharge.  Patient is comfortable with plan discussed in room and is stable for discharge at this time.  I have discussed strict return precautions for returning to the emergency department.  Patient was encouraged to follow-up with PCP/specialist refer to at discharge.                                 Medical Decision Making Amount  and/or Complexity of Data Reviewed External Data Reviewed: labs, radiology and notes. Radiology: ordered and independent interpretation performed. Decision-making details documented in ED Course.  Risk OTC drugs. Decision regarding hospitalization. Diagnosis or treatment significantly limited by social determinants of health.          Final Clinical Impression(s) / ED Diagnoses Final diagnoses:  Sprain of left ankle, unspecified ligament, initial encounter    Rx / DC Orders ED Discharge Orders     None         Casmere Hollenbeck A, PA-C 10/10/23 2333    Loetta Rough, MD 10/11/23 [phone]

## 2024-02-20 DIAGNOSIS — Z3202 Encounter for pregnancy test, result negative: Secondary | ICD-10-CM | POA: Diagnosis not present

## 2024-02-23 DIAGNOSIS — Z113 Encounter for screening for infections with a predominantly sexual mode of transmission: Secondary | ICD-10-CM | POA: Diagnosis not present

## 2024-04-16 DIAGNOSIS — G8929 Other chronic pain: Secondary | ICD-10-CM | POA: Diagnosis not present

## 2024-04-16 DIAGNOSIS — R1013 Epigastric pain: Secondary | ICD-10-CM | POA: Diagnosis not present

## 2024-04-16 DIAGNOSIS — E669 Obesity, unspecified: Secondary | ICD-10-CM | POA: Diagnosis not present

## 2024-04-16 DIAGNOSIS — D649 Anemia, unspecified: Secondary | ICD-10-CM | POA: Diagnosis not present

## 2024-04-16 DIAGNOSIS — O169 Unspecified maternal hypertension, unspecified trimester: Secondary | ICD-10-CM | POA: Diagnosis not present

## 2024-04-16 DIAGNOSIS — K5909 Other constipation: Secondary | ICD-10-CM | POA: Diagnosis not present

## 2024-04-16 DIAGNOSIS — Z6831 Body mass index (BMI) 31.0-31.9, adult: Secondary | ICD-10-CM | POA: Diagnosis not present

## 2024-04-16 DIAGNOSIS — Z720 Tobacco use: Secondary | ICD-10-CM | POA: Diagnosis not present

## 2024-10-01 DIAGNOSIS — F99 Mental disorder, not otherwise specified: Secondary | ICD-10-CM | POA: Diagnosis not present
